# Patient Record
Sex: Female | Born: 1985 | Race: Black or African American | Hispanic: No | Marital: Married | Smoking: Never smoker
Health system: Southern US, Community
[De-identification: ages and names within clinical notes are randomized; demographics above are authoritative.]

## PROBLEM LIST (undated history)

## (undated) DIAGNOSIS — M5430 Sciatica, unspecified side: Secondary | ICD-10-CM

## (undated) DIAGNOSIS — Z800 Family history of malignant neoplasm of digestive organs: Secondary | ICD-10-CM

## (undated) DIAGNOSIS — Z789 Other specified health status: Secondary | ICD-10-CM

## (undated) HISTORY — PX: NO PAST SURGERIES: SHX2092

## (undated) HISTORY — DX: Other specified health status: Z78.9

## (undated) HISTORY — DX: Family history of malignant neoplasm of digestive organs: Z80.0

---

## 2016-03-17 ENCOUNTER — Encounter: Payer: Self-pay | Admitting: Internal Medicine

## 2016-03-17 ENCOUNTER — Ambulatory Visit (INDEPENDENT_AMBULATORY_CARE_PROVIDER_SITE_OTHER): Payer: BLUE CROSS/BLUE SHIELD | Admitting: Internal Medicine

## 2016-03-17 VITALS — BP 122/78 | HR 84 | Ht 69.0 in | Wt 339.0 lb

## 2016-03-17 DIAGNOSIS — J3089 Other allergic rhinitis: Secondary | ICD-10-CM | POA: Diagnosis not present

## 2016-03-17 DIAGNOSIS — N926 Irregular menstruation, unspecified: Secondary | ICD-10-CM | POA: Diagnosis not present

## 2016-03-17 DIAGNOSIS — Z6841 Body Mass Index (BMI) 40.0 and over, adult: Secondary | ICD-10-CM | POA: Diagnosis not present

## 2016-03-17 NOTE — Patient Instructions (Signed)
Breast Self-Awareness Practicing breast self-awareness may pick up problems early, prevent significant medical complications, and possibly save your life. By practicing breast self-awareness, you can become familiar with how your breasts look and feel and if your breasts are changing. This allows you to notice changes early. It can also offer you some reassurance that your breast health is good. One way to learn what is normal for your breasts and whether your breasts are changing is to do a breast self-exam. If you find a lump or something that was not present in the past, it is best to contact your caregiver right away. Other findings that should be evaluated by your caregiver include nipple discharge, especially if it is bloody; skin changes or reddening; areas where the skin seems to be pulled in (retracted); or new lumps and bumps. Breast pain is seldom associated with cancer (malignancy), but should also be evaluated by a caregiver. HOW TO PERFORM A BREAST SELF-EXAM The best time to examine your breasts is 5-7 days after your menstrual period is over. During menstruation, the breasts are lumpier, and it may be more difficult to pick up changes. If you do not menstruate, have reached menopause, or had your uterus removed (hysterectomy), you should examine your breasts at regular intervals, such as monthly. If you are breastfeeding, examine your breasts after a feeding or after using a breast pump. Breast implants do not decrease the risk for lumps or tumors, so continue to perform breast self-exams as recommended. Talk to your caregiver about how to determine the difference between the implant and breast tissue. Also, talk about the amount of pressure you should use during the exam. Over time, you will become more familiar with the variations of your breasts and more comfortable with the exam. A breast self-exam requires you to remove all your clothes above the waist. 1. Look at your breasts and nipples.  Stand in front of a mirror in a room with good lighting. With your hands on your hips, push your hands firmly downward. Look for a difference in shape, contour, and size from one breast to the other (asymmetry). Asymmetry includes puckers, dips, or bumps. Also, look for skin changes, such as reddened or scaly areas on the breasts. Look for nipple changes, such as discharge, dimpling, repositioning, or redness. 2. Carefully feel your breasts. This is best done either in the shower or tub while using soapy water or when flat on your back. Place the arm (on the side of the breast you are examining) above your head. Use the pads (not the fingertips) of your three middle fingers on your opposite hand to feel your breasts. Start in the underarm area and use  inch (2 cm) overlapping circles to feel your breast. Use 3 different levels of pressure (light, medium, and firm pressure) at each circle before moving to the next circle. The light pressure is needed to feel the tissue closest to the skin. The medium pressure will help to feel breast tissue a little deeper, while the firm pressure is needed to feel the tissue close to the ribs. Continue the overlapping circles, moving downward over the breast until you feel your ribs below your breast. Then, move one finger-width towards the center of the body. Continue to use the  inch (2 cm) overlapping circles to feel your breast as you move slowly up toward the collar bone (clavicle) near the base of the neck. Continue the up and down exam using all 3 pressures until you reach the   middle of the chest. Do this with each breast, carefully feeling for lumps or changes. 3.  Keep a written record with breast changes or normal findings for each breast. By writing this information down, you do not need to depend only on memory for size, tenderness, or location. Write down where you are in your menstrual cycle, if you are still menstruating. Breast tissue can have some lumps or  thick tissue. However, see your caregiver if you find anything that concerns you.  SEEK MEDICAL CARE IF:  You see a change in shape, contour, or size of your breasts or nipples.   You see skin changes, such as reddened or scaly areas on the breasts or nipples.   You have an unusual discharge from your nipples.   You feel a new lump or unusually thick areas.    This information is not intended to replace advice given to you by your health care provider. Make sure you discuss any questions you have with your health care provider.   Document Released: 12/01/2005 Document Revised: 11/17/2012 Document Reviewed: 03/17/2012 Elsevier Interactive Patient Education 2016 Elsevier Inc.  

## 2016-03-17 NOTE — Progress Notes (Signed)
Date:  03/17/2016   Name:  Morgan Grant   DOB:  12-12-1986   MRN:  [id number]   Chief Complaint: New Evaluation Needs to establish with PCP.  [place] in [place].  She thinks she had the flu last week but now feels pretty well. Previously in school at [employer] and was using the infirmary.  She has no concerns today.   Irregular menses - this has been a problem since puberty.  She has a menstrual cycle about every 8 weeks, lasting 4-5 days.  She has been evaluated by GYN - she does not have PCOS.  She tried a low dose OCP last year for 5 months.  She had good regulation of her cycles but did not like some of the vague side effects.  Since she was no longer requiring contraception, she stopped them.  She is currently not active sexually but may desire to resume OCPs in the future.  Seasonal Allergies - mild symptoms controlled with claritin or allegra.  No problems with shortness of breath or wheezing.  No hives or rash.  Review of Systems  Constitutional: Negative for fever, chills, fatigue and unexpected weight change.  HENT: Positive for postnasal drip and sneezing. Negative for hearing loss.   Eyes: Negative for visual disturbance.  Respiratory: Negative for choking, chest tightness, shortness of breath and wheezing.   Cardiovascular: Negative for chest pain, palpitations and leg swelling.  Gastrointestinal: Negative for abdominal pain.  Genitourinary: Positive for menstrual problem. Negative for dysuria and pelvic pain.  Musculoskeletal: Negative for joint swelling.  Neurological: Negative for dizziness and headaches.  Psychiatric/Behavioral: Negative for sleep disturbance. The patient is not nervous/anxious.     There are no active problems to display for this patient.   Prior to Admission medications   Not on File    No Known Allergies  Past Surgical History  Procedure Laterality Date  . No past surgeries      Social History  Substance Use Topics  .  Smoking status: Never Smoker   . Smokeless tobacco: None  . Alcohol Use: 0.0 oz/week    0 Standard drinks or equivalent per week     Comment: occasionally    Medication list has been reviewed and updated.   Physical Exam  Constitutional: She is oriented to person, place, and time. She appears well-developed. No distress.  HENT:  Head: Normocephalic and atraumatic.  Neck: Normal range of motion. Neck supple. No thyromegaly present.  Cardiovascular: Normal rate, regular rhythm and normal heart sounds.   Pulmonary/Chest: Effort normal and breath sounds normal. No respiratory distress.  Musculoskeletal: She exhibits no tenderness.  Neurological: She is alert and oriented to person, place, and time. She has normal reflexes.  Skin: Skin is warm and dry. No rash noted.  Psychiatric: She has a normal mood and affect. Her behavior is normal. Thought content normal.  Nursing note and vitals reviewed.   BP 122/78 mmHg  Pulse 84  Ht 5\' 9"  (1.753 m)  Wt 339 lb (153.769 kg)  BMI 50.04 kg/m2  LMP 02/25/2016  Assessment and Plan: 1. Irregular menses Long-standing with previously normal exams Can decrease Pap smears to every 3 yrs Consider resuming OCPs if needed  2. Environmental and seasonal allergies Continue over the counter claritin or allegra  3. BMI 50.0-59.9, adult [facility](HCC) Patient has had stable weight for many years Discussed regular exercise    Bari EdwardLaura Berglund, MD [facility]  03/17/2016  

## 2016-04-07 ENCOUNTER — Telehealth: Payer: Self-pay

## 2016-04-07 ENCOUNTER — Other Ambulatory Visit: Payer: Self-pay

## 2016-04-07 ENCOUNTER — Other Ambulatory Visit: Payer: Self-pay | Admitting: Internal Medicine

## 2016-04-07 MED ORDER — NORGESTIM-ETH ESTRAD TRIPHASIC 0.18/0.215/0.25 MG-35 MCG PO TABS: 1.0000 | ORAL_TABLET | Freq: Every day | ORAL | Status: DC

## 2016-04-07 NOTE — Telephone Encounter (Signed)
Patient called in today and states that she was told to call you and let you know which medication (Birth Control) she was on. She asked if you could send in Ortho Tricyclen generic.

## 2016-09-17 ENCOUNTER — Ambulatory Visit (INDEPENDENT_AMBULATORY_CARE_PROVIDER_SITE_OTHER): Payer: BLUE CROSS/BLUE SHIELD | Admitting: Internal Medicine

## 2016-09-17 ENCOUNTER — Encounter: Payer: Self-pay | Admitting: Internal Medicine

## 2016-09-17 VITALS — BP 121/84 | HR 60 | Resp 16 | Ht 69.0 in | Wt 335.8 lb

## 2016-09-17 DIAGNOSIS — Z111 Encounter for screening for respiratory tuberculosis: Secondary | ICD-10-CM

## 2016-09-17 DIAGNOSIS — Z23 Encounter for immunization: Secondary | ICD-10-CM | POA: Diagnosis not present

## 2016-09-17 NOTE — Progress Notes (Signed)
    Date:  09/17/2016   Name:  Morgan Grant   DOB:  10/21/1986   MRN:  [id number]   Chief Complaint: PPD Placement and Employment Physical (work clearance ) Patient presents for pre-employment exam. She also needs PPD. 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  Review of Systems  Constitutional: Negative for fatigue, fever and unexpected weight change.  HENT: Negative for hearing loss.   Eyes: Negative for visual disturbance.  Respiratory: Negative for cough, chest tightness and shortness of breath.   Cardiovascular: Negative for chest pain, palpitations and leg swelling.  Gastrointestinal: Negative for abdominal pain.  Endocrine: Negative for polydipsia and polyuria.  Genitourinary: Negative for dysuria and hematuria.  Musculoskeletal: Negative for arthralgias.  Neurological: Negative for tremors, numbness and headaches.  Psychiatric/Behavioral: Negative for dysphoric mood.    Patient Active Problem List   Diagnosis Date Noted  . Irregular menses 03/17/2016  . Environmental and seasonal allergies 03/17/2016  . BMI 50.0-59.9, adult (HCC) 03/17/2016    Prior to Admission medications   Not on File    No Known Allergies  Past Surgical History:  Procedure Laterality Date  . NO PAST SURGERIES      Social History  Substance Use Topics  . Smoking status: Never Smoker  . Smokeless tobacco: Never Used  . Alcohol use 0.0 oz/week     Comment: occasionally     Medication list has been reviewed and updated.   Physical Exam  Constitutional: She is oriented to person, place, and time. She appears well-developed. No distress.  HENT:  Head: Normocephalic and atraumatic.  Neck: Normal range of motion. Neck supple. No thyromegaly present.  Cardiovascular: Normal rate, regular rhythm and normal heart sounds.   Pulmonary/Chest: Effort normal and breath sounds normal. No respiratory distress.  Musculoskeletal: She exhibits no edema or tenderness.  Neurological: She is  alert and oriented to person, place, and time.  Skin: Skin is warm and dry. No rash noted.  Psychiatric: She has a normal mood and affect. Her speech is normal and behavior is normal. Thought content normal.  Nursing note and vitals reviewed.   BP 121/84   Pulse 60   Resp 16   Ht 5\' 9"  (1.753 m)   Wt (!) 335 lb 12.8 oz (152.3 kg)   LMP 08/06/2016 (Approximate)   SpO2 98%   BMI 49.59 kg/m   Assessment and Plan: 1. Need for influenza vaccination - Flu Vaccine QUAD 36+ mos PF IM (Fluarix & Fluzone Quad PF)  2. Screening for tuberculosis Work employment form completed Return in 2 days for PPD reading - PPD   Bari EdwardLaura Aarian Griffie, MD [facility]  09/17/2016

## 2016-12-16 ENCOUNTER — Other Ambulatory Visit: Payer: Self-pay | Admitting: Internal Medicine

## 2016-12-16 ENCOUNTER — Ambulatory Visit: Payer: BLUE CROSS/BLUE SHIELD | Admitting: Internal Medicine

## 2017-11-18 ENCOUNTER — Ambulatory Visit: Payer: BLUE CROSS/BLUE SHIELD | Admitting: Internal Medicine

## 2018-01-24 ENCOUNTER — Ambulatory Visit
Admission: EM | Admit: 2018-01-24 | Discharge: 2018-01-24 | Disposition: A | Discharge status: Home / Self Care | Payer: BLUE CROSS/BLUE SHIELD | Attending: Family Medicine | Admitting: Family Medicine

## 2018-01-24 ENCOUNTER — Encounter: Payer: Self-pay | Admitting: Gynecology

## 2018-01-24 ENCOUNTER — Other Ambulatory Visit: Payer: Self-pay

## 2018-01-24 DIAGNOSIS — M5432 Sciatica, left side: Secondary | ICD-10-CM

## 2018-01-24 HISTORY — DX: Sciatica, unspecified side: M54.30

## 2018-01-24 MED ORDER — CYCLOBENZAPRINE HCL 10 MG PO TABS: 10.0000 mg | ORAL_TABLET | Freq: Three times a day (TID) | ORAL | 0 refills | Status: DC | PRN

## 2018-01-24 MED ORDER — PREDNISONE 20 MG PO TABS: ORAL_TABLET | ORAL | 0 refills | Status: DC

## 2018-01-24 NOTE — ED Provider Notes (Signed)
[facility]    CSN: [id number] Arrival date & time: 01/24/18  9528     History   Chief Complaint Chief Complaint  Patient presents with  . Sciatica    HPI Morgan Grant is a 32 y.o. female.   The history is provided by the patient.  Back Pain  Location:  Lumbar spine Quality:  Aching Radiates to:  L posterior upper leg Pain severity:  Mild Pain is:  Same all the time Onset quality:  Sudden Duration:  3 months Timing:  Constant Progression:  Unchanged Chronicity:  New Context: lifting heavy objects   Relieved by:  Nothing Ineffective treatments: acupuncture, chiropractic. Associated symptoms: no abdominal pain, no abdominal swelling, no bladder incontinence, no bowel incontinence, no chest pain, no dysuria, no fever, no headaches, no leg pain, no numbness, no paresthesias, no pelvic pain, no perianal numbness, no tingling, no weakness and no weight loss   Risk factors: no hx of cancer, no hx of osteoporosis, no lack of exercise, no menopause, not obese, not pregnant, no recent surgery, no steroid use and no vascular disease     Past Medical History:  Diagnosis Date  . Sciatic nerve pain     Patient Active Problem List   Diagnosis Date Noted  . Irregular menses 03/17/2016  . Environmental and seasonal allergies 03/17/2016  . BMI 50.0-59.9, adult (HCC) 03/17/2016    Past Surgical History:  Procedure Laterality Date  . NO PAST SURGERIES      OB History    No data available       Home Medications    Prior to Admission medications   Medication Sig Start Date End Date Taking? Authorizing Provider  cyclobenzaprine (FLEXERIL) 10 MG tablet Take 1 tablet (10 mg total) by mouth 3 (three) times daily as needed for muscle spasms. 01/24/18   Payton Mccallum, MD  predniSONE (DELTASONE) 20 MG tablet 3 tabs po once day 1, then 2 tab po qd x 3 days, then 1 tab po qd x 3 days 01/24/18   Payton Mccallum, MD  TRI-PREVIFEM 0.18/0.215/0.25 MG-35 MCG tablet Take 1  tablet by mouth daily. 11/14/16   [provider]    Family History Family History  Problem Relation Age of Onset  . Hypertension Mother   . Diabetes Maternal Grandmother   . Pancreatic cancer Maternal Grandmother     Social History Social History   Tobacco Use  . Smoking status: Never Smoker  . Smokeless tobacco: Never Used  Substance Use Topics  . Alcohol use: Yes    Alcohol/week: 0.0 oz    Comment: occasionally  . Drug use: No     Allergies   Patient has no known allergies.   Review of Systems Review of Systems  Constitutional: Negative for fever and weight loss.  Cardiovascular: Negative for chest pain.  Gastrointestinal: Negative for abdominal pain and bowel incontinence.  Genitourinary: Negative for bladder incontinence, dysuria and pelvic pain.  Musculoskeletal: Positive for back pain.  Neurological: Negative for tingling, weakness, numbness, headaches and paresthesias.     Physical Exam Triage Vital Signs ED Triage Vitals  Enc Vitals Group     BP 01/24/18 0828 126/74     Pulse Rate 01/24/18 0828 60     Resp --      Temp 01/24/18 0828 98.2 F (36.8 C)     Temp Source 01/24/18 0828 Oral     SpO2 01/24/18 0828 100 %     Weight 01/24/18 0830 (!) 320 lb (145.2  kg)     Height 01/24/18 0830 5\' 8"  (1.727 m)     Head Circumference --      Peak Flow --      Pain Score 01/24/18 0830 8     Pain Loc --      Pain Edu? --      Excl. in GC? --    No data found.  Updated Vital Signs BP 126/74 (BP Location: Left Arm)   Pulse 60   Temp 98.2 F (36.8 C) (Oral)   Ht 5\' 8"  (1.727 m)   Wt (!) 320 lb (145.2 kg)   LMP 12/29/2017   SpO2 100%   BMI 48.66 kg/m   Visual Acuity Right Eye Distance:   Left Eye Distance:   Bilateral Distance:    Right Eye Near:   Left Eye Near:    Bilateral Near:     Physical Exam  Constitutional: She appears well-developed and well-nourished. No distress.  Musculoskeletal: She exhibits tenderness. She exhibits no  edema.       Lumbar back: She exhibits tenderness (over the lumbar paraspinous muscles and left buttock) and spasm. She exhibits normal range of motion, no bony tenderness, no swelling, no edema, no deformity, no laceration, no pain and normal pulse.  Neurological: She is alert. She has normal reflexes. She displays normal reflexes. She exhibits normal muscle tone.  Skin: Skin is warm and dry. No rash noted. She is not diaphoretic. No erythema.  Nursing note and vitals reviewed.    UC Treatments / Results  Labs (all labs ordered are listed, but only abnormal results are displayed) Labs Reviewed - No data to display  EKG  EKG Interpretation None       Radiology No results found.  Procedures Procedures (including critical care time)  Medications Ordered in UC Medications - No data to display   Initial Impression / Assessment and Plan / UC Course  I have reviewed the triage vital signs and the nursing notes.  Pertinent labs & imaging results that were available during my care of the patient were reviewed by me and considered in my medical decision making (see chart for details).       Final Clinical Impressions(s) / UC Diagnoses   Final diagnoses:  Sciatica of left side    ED Discharge Orders        Ordered    predniSONE (DELTASONE) 20 MG tablet     01/24/18 0917    cyclobenzaprine (FLEXERIL) 10 MG tablet  3 times daily PRN     01/24/18 0917     1. diagnosis reviewed with patient 2. rx as per orders above; reviewed possible side effects, interactions, risks and benefits  3. Follow-up prn if symptoms worsen or don't improve Controlled Substance Prescriptions [place] Controlled Substance Registry consulted? Not Applicable   Payton Mccallumonty, Minetta Krisher, MD 01/24/18 [id number]

## 2018-01-24 NOTE — ED Triage Notes (Signed)
Patient c/o sciatic nerve pain diagnose x 4 yrs ago. Per patient x 3 months pain is worse.

## 2018-09-21 LAB — CBC AND DIFFERENTIAL
HCT: 40 (ref 36–46)
Hemoglobin: 13.6 (ref 12.0–16.0)
Platelets: 302 (ref 150–399)
WBC: 7.1

## 2018-09-21 LAB — BASIC METABOLIC PANEL
BUN: 6 (ref 4–21)
Creatinine: 0.8 (ref 0.5–1.1)
Glucose: 80
Potassium: 3.9 (ref 3.4–5.3)
Sodium: 140 (ref 137–147)

## 2018-09-21 LAB — LIPID PANEL
HDL: 47 (ref 35–70)
LDL Cholesterol: 110
Triglycerides: 70 (ref 40–160)

## 2018-09-21 LAB — HIV ANTIBODY (ROUTINE TESTING W REFLEX): HIV 1&2 Ab, 4th Generation: NONREACTIVE

## 2018-09-21 LAB — HEMOGLOBIN A1C: Hemoglobin A1C: 5.7

## 2018-09-21 LAB — HM PAP SMEAR: HM Pap smear: NEGATIVE

## 2019-09-09 ENCOUNTER — Ambulatory Visit: Payer: Self-pay | Admitting: Internal Medicine

## 2019-09-30 ENCOUNTER — Encounter: Payer: Self-pay | Admitting: Internal Medicine

## 2019-09-30 ENCOUNTER — Other Ambulatory Visit: Payer: Self-pay

## 2019-09-30 ENCOUNTER — Ambulatory Visit: Payer: BC Managed Care – PPO | Admitting: Internal Medicine

## 2019-09-30 VITALS — BP 118/82 | HR 69 | Ht 68.0 in | Wt 333.0 lb

## 2019-09-30 DIAGNOSIS — Z23 Encounter for immunization: Secondary | ICD-10-CM

## 2019-09-30 DIAGNOSIS — Z6841 Body Mass Index (BMI) 40.0 and over, adult: Secondary | ICD-10-CM | POA: Diagnosis not present

## 2019-09-30 DIAGNOSIS — F39 Unspecified mood [affective] disorder: Secondary | ICD-10-CM | POA: Diagnosis not present

## 2019-09-30 DIAGNOSIS — N926 Irregular menstruation, unspecified: Secondary | ICD-10-CM | POA: Diagnosis not present

## 2019-09-30 NOTE — Progress Notes (Signed)
Date:  09/30/2019   Name:  Morgan Grant   DOB:  11-Oct-1986   MRN:  [id number]   Chief Complaint: Establish Care and Depression (12-PHQ9. GAD7-16. Would like to be referred to Dr. Mady Haagensen in [facility]. Address: [address], [place], [place] [address])  Depression        This is a new problem.The problem is unchanged.  Associated symptoms include insomnia, restlessness and decreased interest.  Associated symptoms include no fatigue, no headaches and no suicidal ideas.     The symptoms are aggravated by social issues (mostly by Covid restrictions).  Past treatments include psychotherapy (therapy in the past but did not stick with it).  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  she is in a new relationship that is going very well.     Review of Systems  Constitutional: Negative for chills, fatigue and fever.  Respiratory: Negative for cough, chest tightness and shortness of breath.   Cardiovascular: Negative for chest pain, palpitations and leg swelling.  Gastrointestinal: Negative for constipation, diarrhea and nausea.  Musculoskeletal: Negative for arthralgias and joint swelling.  Neurological: Negative for dizziness, weakness and headaches.  Psychiatric/Behavioral: Positive for depression, dysphoric mood and sleep disturbance. Negative for suicidal ideas. The patient is nervous/anxious and has insomnia.     Patient Active Problem List   Diagnosis Date Noted  . Irregular menses 03/17/2016  . Environmental and seasonal allergies 03/17/2016  . BMI 50.0-59.9, adult ([place]) 03/17/2016    No Known Allergies  Past Surgical History:  Procedure Laterality Date  . NO PAST SURGERIES      Social History   Tobacco Use  . Smoking status: Never Smoker  . Smokeless tobacco: Never Used  Substance Use Topics  . Alcohol use: Yes    Alcohol/week: 0.0 standard drinks    Comment: occasionally  . Drug use: No     Medication list has been  reviewed and updated.  Current Meds  Medication Sig  . JUNEL 1/20 1-20 MG-MCG tablet Take 1 tablet by mouth daily.    PHQ 2/9 Scores 09/30/2019  PHQ - 2 Score 3  PHQ- 9 Score 12   GAD 7 : Generalized Anxiety Score 09/30/2019  Nervous, Anxious, on Edge 3  Control/stop worrying 3  Worry too much - different things 3  Trouble relaxing 3  Restless 1  Easily annoyed or irritable 1  Afraid - awful might happen 2  Total GAD 7 Score 16  Anxiety Difficulty Somewhat difficult     BP Readings from Last 3 Encounters:  09/30/19 118/82  01/24/18 126/74  09/17/16 121/84    Physical Exam Vitals signs and nursing note reviewed.  Constitutional:      General: She is not in acute distress.    Appearance: Normal appearance. She is well-developed.  HENT:     Head: Normocephalic and atraumatic.  Neck:     Musculoskeletal: Normal range of motion.  Cardiovascular:     Rate and Rhythm: Normal rate and regular rhythm.     Pulses: Normal pulses.     Heart sounds: No murmur.  Pulmonary:     Effort: Pulmonary effort is normal. No respiratory distress.     Breath sounds: No wheezing or rhonchi.  Musculoskeletal:     Right lower leg: No edema.     Left lower leg: No edema.  Lymphadenopathy:     Cervical: No cervical adenopathy.  Skin:    General: Skin is  warm and dry.     Capillary Refill: Capillary refill takes less than 2 seconds.     Findings: No rash.  Neurological:     General: No focal deficit present.     Mental Status: She is alert and oriented to person, place, and time.  Psychiatric:        Behavior: Behavior normal.        Thought Content: Thought content normal.     Wt Readings from Last 3 Encounters:  09/30/19 (!) 333 lb (151 kg)  01/24/18 (!) 320 lb (145.2 kg)  09/17/16 (!) 335 lb 12.8 oz (152.3 kg)    BP 118/82   Pulse 69   Ht 5\' 8"  (1.727 m)   Wt (!) 333 lb (151 kg)   SpO2 98%   BMI 50.63 kg/m   Assessment and Plan: 1. Mood disorder (HCC) Recommend  that pt see a counselor - she can self refer to the practice of her choice and if a referral is needed, she will call. She is clinically stable without SI/HI; she has excellent social support   2. BMI 50.0-59.9, adult (HCC) Continue efforts at exercise and weight loss  3. Irregular menses On OCPs; pap normal last year Will get prior records   Partially dictated using 04-12-1995. Any errors are unintentional.  Animal nutritionist, MD [facility]  09/30/2019

## 2020-02-15 LAB — FETAL NONSTRESS TEST

## 2020-03-29 ENCOUNTER — Encounter: Payer: BC Managed Care – PPO | Admitting: Internal Medicine

## 2020-03-29 NOTE — Progress Notes (Deleted)
    Date:  03/29/2020   Name:  Morgan Grant   DOB:  1986-06-25   MRN:  [id number]   Chief Complaint: No chief complaint on file. Morgan Grant is a 34 y.o. female who presents today for her Complete Annual Exam. She feels {DESC; WELL/FAIRLY WELL/POORLY:18703}. She reports exercising ***. She reports she is sleeping {DESC; WELL/FAIRLY WELL/POORLY:18703}.   Pap  09/2018 Immunization History  Administered Date(s) Administered  . Influenza,inj,Quad PF,6+ Mos 09/17/2016, 09/30/2019  . Influenza-Unspecified 09/19/2018  . PPD Test 09/17/2016  . Tdap 09/19/2018    HPI  Lab Results  Component Value Date   CREATININE 0.8 09/21/2018   BUN 6 09/21/2018   NA 140 09/21/2018   K 3.9 09/21/2018   Lab Results  Component Value Date   HDL 47 09/21/2018   LDLCALC 110 09/21/2018   TRIG 70 09/21/2018   No results found for: TSH Lab Results  Component Value Date   HGBA1C 5.7 09/21/2018   Lab Results  Component Value Date   WBC 7.1 09/21/2018   HGB 13.6 09/21/2018   HCT 40 09/21/2018   PLT 302 09/21/2018   No results found for: ALT, AST, GGT, ALKPHOS, BILITOT   Review of Systems  Patient Active Problem List   Diagnosis Date Noted  . Irregular menses 03/17/2016  . Environmental and seasonal allergies 03/17/2016  . BMI 50.0-59.9, adult (HCC) 03/17/2016    No Known Allergies  Past Surgical History:  Procedure Laterality Date  . NO PAST SURGERIES      Social History   Tobacco Use  . Smoking status: Never Smoker  . Smokeless tobacco: Never Used  Substance Use Topics  . Alcohol use: Yes    Alcohol/week: 0.0 standard drinks    Comment: occasionally  . Drug use: No     Medication list has been reviewed and updated.  No outpatient medications have been marked as taking for the 03/29/20 encounter (Appointment) with Reubin Milan, MD.    [facility] 2/9 Scores 09/30/2019  PHQ - 2 Score 3  PHQ- 9 Score 12    BP Readings from Last 3 Encounters:  09/30/19 118/82  01/24/18  126/74  09/17/16 121/84    Physical Exam  Wt Readings from Last 3 Encounters:  09/30/19 (!) 333 lb (151 kg)  01/24/18 (!) 320 lb (145.2 kg)  09/17/16 (!) 335 lb 12.8 oz (152.3 kg)    There were no vitals taken for this visit.  Assessment and Plan:

## 2020-03-30 ENCOUNTER — Encounter: Payer: BC Managed Care – PPO | Admitting: Internal Medicine

## 2020-04-19 ENCOUNTER — Other Ambulatory Visit: Payer: Self-pay

## 2020-04-19 ENCOUNTER — Ambulatory Visit: Payer: BC Managed Care – PPO | Admitting: Obstetrics and Gynecology

## 2020-04-19 ENCOUNTER — Encounter: Payer: Self-pay | Admitting: Obstetrics and Gynecology

## 2020-04-19 VITALS — BP 137/83 | HR 53 | Ht 69.49 in | Wt 314.8 lb

## 2020-04-19 DIAGNOSIS — T192XXS Foreign body in vulva and vagina, sequela: Secondary | ICD-10-CM

## 2020-04-19 NOTE — Progress Notes (Signed)
HPI:      Morgan Grant is a 34 y.o. No obstetric history on file. who LMP was Patient's last menstrual period was 04/17/2020 (exact date).  Subjective:   She presents today because she began her menses and realized that she had a retained tampon from her menses 1 month ago. She noticed it at the introitus. She removed it herself. She called her family physician and was placed on Flagyl she is partway through the course of Flagyl. She has no specific complaints has noticed no fevers or other systemic effects. She is in the middle of a normal menses at this time and is using pads for menstrual bleeding. Patient takes OCPs and has normal regular cycles on OCPs.    Hx: The following portions of the patient's history were reviewed and updated as appropriate:             She  has no past medical history on file. She does not have any pertinent problems on file. She  has a past surgical history that includes No past surgeries. Her family history includes Cancer in her father; Diabetes in her maternal grandmother; Hypertension in her mother; Pancreatic cancer in her maternal grandmother. She  reports that she has never smoked. She has never used smokeless tobacco. She reports current alcohol use. She reports that she does not use drugs. She has a current medication list which includes the following prescription(s): junel 1/20 and metronidazole. She has No Known Allergies.       Review of Systems:  Review of Systems  Constitutional: Denied constitutional symptoms, night sweats, recent illness, fatigue, fever, insomnia and weight loss.  Eyes: Denied eye symptoms, eye pain, photophobia, vision change and visual disturbance.  Ears/Nose/Throat/Neck: Denied ear, nose, throat or neck symptoms, hearing loss, nasal discharge, sinus congestion and sore throat.  Cardiovascular: Denied cardiovascular symptoms, arrhythmia, chest pain/pressure, edema, exercise intolerance, orthopnea and palpitations.   Respiratory: Denied pulmonary symptoms, asthma, pleuritic pain, productive sputum, cough, dyspnea and wheezing.  Gastrointestinal: Denied, gastro-esophageal reflux, melena, nausea and vomiting.  Genitourinary: Denied genitourinary symptoms including symptomatic vaginal discharge, pelvic relaxation issues, and urinary complaints.  Musculoskeletal: Denied musculoskeletal symptoms, stiffness, swelling, muscle weakness and myalgia.  Dermatologic: Denied dermatology symptoms, rash and scar.  Neurologic: Denied neurology symptoms, dizziness, headache, neck pain and syncope.  Psychiatric: Denied psychiatric symptoms, anxiety and depression.  Endocrine: Denied endocrine symptoms including hot flashes and night sweats.   Meds:   Current Outpatient Medications on File Prior to Visit  Medication Sig Dispense Refill  . JUNEL 1/20 1-20 MG-MCG tablet Take 1 tablet by mouth daily.    . metroNIDAZOLE (FLAGYL) 500 MG tablet Take 500 mg by mouth 2 (two) times daily.     No current facility-administered medications on file prior to visit.    Objective:     Vitals:   04/19/20 0854  BP: 137/83  Pulse: (!) 53    Patient not having any symptoms, is currently bleeding with her menses and declined examination today.            Assessment:    No obstetric history on file. Patient Active Problem List   Diagnosis Date Noted  . Irregular menses 03/17/2016  . Environmental and seasonal allergies 03/17/2016  . BMI 50.0-59.9, adult ([place]) 03/17/2016     1. Retained tampon, sequela     Patient was able to remove tampon by herself and is partly through a course of Flagyl prescribed by her primary care physician.   Plan:  1. Expect no further issues from retained tampon.  2. Patient to return for annual examination and Pap smear. Orders No orders of the defined types were placed in this encounter.   No orders of the defined types were placed in this encounter.     F/U  Return in about  1 month (around 05/20/2020) for Annual Physical. I spent 18 minutes involved in the care of this patient preparing to see the patient by obtaining and reviewing her medical history (including labs, imaging tests and prior procedures), documenting clinical information in the electronic health record (EHR), counseling and coordinating care plans, writing and sending prescriptions, ordering tests or procedures and directly communicating with the patient by discussing pertinent items from her history and physical exam as well as detailing my assessment and plan as noted above so that she has an informed understanding.  All of her questions were answered.  Elonda Husky, M.D. 04/19/2020 9:17 AM

## 2020-05-28 ENCOUNTER — Other Ambulatory Visit: Payer: Self-pay

## 2020-05-28 ENCOUNTER — Ambulatory Visit
Admission: EM | Admit: 2020-05-28 | Discharge: 2020-05-28 | Disposition: A | Discharge status: Home / Self Care | Payer: BC Managed Care – PPO | Attending: Family Medicine | Admitting: Family Medicine

## 2020-05-28 ENCOUNTER — Encounter: Payer: Self-pay | Admitting: Emergency Medicine

## 2020-05-28 DIAGNOSIS — M62838 Other muscle spasm: Secondary | ICD-10-CM | POA: Diagnosis not present

## 2020-05-28 MED ORDER — KETOROLAC TROMETHAMINE 60 MG/2ML IM SOLN
60.0000 mg | Freq: Once | INTRAMUSCULAR | Status: AC
  Administered 2020-05-28: 60 mg via INTRAMUSCULAR

## 2020-05-28 MED ORDER — CYCLOBENZAPRINE HCL 10 MG PO TABS: 10.0000 mg | ORAL_TABLET | Freq: Two times a day (BID) | ORAL | 0 refills | Status: DC | PRN

## 2020-05-28 NOTE — ED Provider Notes (Signed)
[facility] ____________________________________________  Time seen: Approximately 12:00 PM  I have reviewed the triage vital signs and the nursing notes.   HISTORY  Chief Complaint Back Pain   HPI Morgan Grant is a 34 y.o. female present for evaluation of left lower back pain.  Patient reports she is recent been exercising more and has had some tightness in her low back on the left side.  States this morning she was leaning doing laundry and felt a spasming pain to left low back.  States pain is worse with movement, better with rest.  Describes pain as a catching spasming pain.  Denies pain radiation, paresthesias, fall, direct injury.  Denies chest pain or shortness of breath, abdominal pain, dysuria, bowel changes.  Has not take any over-the-counter medication for the same complaint.  Reports otherwise doing well.  Patient's last menstrual period was 05/07/2020.  Denies pregnancy. Morgan Hess, MD : PCP  History reviewed. No pertinent past medical history.  Patient Active Problem List   Diagnosis Date Noted  . Irregular menses 03/17/2016  . Environmental and seasonal allergies 03/17/2016  . BMI 50.0-59.9, adult ([place]) 03/17/2016    Past Surgical History:  Procedure Laterality Date  . NO PAST SURGERIES       No current facility-administered medications for this encounter.  Current Outpatient Medications:  .  JUNEL 1/20 1-20 MG-MCG tablet, Take 1 tablet by mouth daily., Disp: , Rfl:  .  cyclobenzaprine (FLEXERIL) 10 MG tablet, Take 1 tablet (10 mg total) by mouth 2 (two) times daily as needed for muscle spasms. Do not drive while taking as can cause drowsiness, Disp: 20 tablet, Rfl: 0  Allergies Patient has no known allergies.  Family History  Problem Relation Age of Onset  . Hypertension Mother   . Cancer Father   . Diabetes Maternal Grandmother   . Pancreatic cancer Maternal Grandmother     Social History Social History   Tobacco Use  .  Smoking status: Never Smoker  . Smokeless tobacco: Never Used  Vaping Use  . Vaping Use: Never used  Substance Use Topics  . Alcohol use: Yes    Alcohol/week: 0.0 standard drinks    Comment: occasionally  . Drug use: No    Review of Systems Constitutional: No fever Cardiovascular: Denies chest pain. Respiratory: Denies shortness of breath. Gastrointestinal: No abdominal pain.  No nausea, no vomiting.  No diarrhea.  No constipation. Genitourinary: Negative for dysuria. Musculoskeletal: Positive for back pain. Skin: Negative for rash.   ____________________________________________   PHYSICAL EXAM:  VITAL SIGNS: ED Triage Vitals  Enc Vitals Group     BP 05/28/20 1123 135/75     Pulse Rate 05/28/20 1123 63     Resp 05/28/20 1123 18     Temp 05/28/20 1123 98.1 F (36.7 C)     Temp Source 05/28/20 1123 Oral     SpO2 05/28/20 1123 100 %     Weight 05/28/20 1122 (!) 308 lb (139.7 kg)     Height 05/28/20 1122 5\' 8"  (1.727 m)     Head Circumference --      Peak Flow --      Pain Score 05/28/20 1122 5     Pain Loc --      Pain Edu? --      Excl. in [place]? --     Constitutional: Alert and oriented. Well appearing and in no acute distress. Eyes: Conjunctivae are normal.  ENT      Head: Normocephalic and  atraumatic. Cardiovascular: Normal rate, regular rhythm. Grossly normal heart sounds.  Good peripheral circulation. Respiratory: Normal respiratory effort without tachypnea nor retractions. Breath sounds are clear and equal bilaterally. No wheezes, rales, rhonchi. Gastrointestinal:  No CVA tenderness. Musculoskeletal: No midline cervical, thoracic or lumbar tenderness to palpation. Except: Left lower thoracic to lumbar back tenderness along latissimus dorsi with palpable muscle spasm with overhead reaching, mild pain with lumbar flexion and extension as well as rotation, no midline tenderness, no saddle anesthesia, steady gait, changes positions quickly. Neurologic:  Normal  speech and language.Speech is normal. No gait instability.  Skin:  Skin is warm, dry and intact. No rash noted. Psychiatric: Mood and affect are normal. Speech and behavior are normal. Patient exhibits appropriate insight and judgment   ___________________________________________   LABS (all labs ordered are listed, but only abnormal results are displayed)  Labs Reviewed - No data to display ____________________________________________    PROCEDURES Procedures   INITIAL IMPRESSION / ASSESSMENT AND PLAN / ED COURSE  Pertinent labs & imaging results that were available during my care of the patient were reviewed by me and considered in my medical decision making (see chart for details).  Well-appearing patient.  No acute distress.  Suspect back strain with muscle spasm.  60 mg IM Toradol given once in urgent care.  Will treat with.  Flexeril.  800 mg ibuprofen over-the-counter 3 times a day as needed.  Ice heat stretch.  Supportive care.Discussed indication, risks and benefits of medications with patient.   Discussed follow up with Primary care physician this week as needed. Discussed follow up and return parameters including no resolution or any worsening concerns. Patient verbalized understanding and agreed to plan.   ____________________________________________   FINAL CLINICAL IMPRESSION(S) / ED DIAGNOSES  Final diagnoses:  Muscle spasm     ED Discharge Orders         Ordered    cyclobenzaprine (FLEXERIL) 10 MG tablet  2 times daily PRN     Discontinue  Reprint     05/28/20 1149           Note: This dictation was prepared with Dragon dictation along with smaller phrase technology. Any transcriptional errors that result from this process are unintentional.          Renford Dills, NP 05/28/20 1324

## 2020-05-28 NOTE — ED Triage Notes (Signed)
Patient c/o back spasm that started this morning when she was putting laundry in the basket.

## 2020-05-28 NOTE — Discharge Instructions (Signed)
Take medication as prescribed. Rest. Drink plenty of fluids.  Over-the-counter ibuprofen.  Ice/heat  Follow up with your primary care physician this week as needed. Return to Urgent care for new or worsening concerns.

## 2020-06-02 ENCOUNTER — Other Ambulatory Visit: Payer: Self-pay

## 2020-06-02 DIAGNOSIS — T7840XA Allergy, unspecified, initial encounter: Secondary | ICD-10-CM | POA: Insufficient documentation

## 2020-06-02 DIAGNOSIS — Z793 Long term (current) use of hormonal contraceptives: Secondary | ICD-10-CM | POA: Diagnosis not present

## 2020-06-02 DIAGNOSIS — Z79899 Other long term (current) drug therapy: Secondary | ICD-10-CM | POA: Insufficient documentation

## 2020-06-02 NOTE — ED Triage Notes (Signed)
Patient reports got bitten by what she thinks was a horse fly around 8 pm on left forehead.  Then later noticed that eyes felt heavy and when she looked realized they were swollen.  States she took Zyrtec around 10:30.  Patient denies any other complaints - respiratory distress, or swelling of the mouth or tongue.  Patient is able to speak in complete sentences.

## 2020-06-03 ENCOUNTER — Emergency Department
Admission: EM | Admit: 2020-06-03 | Discharge: 2020-06-03 | Discharge status: Against Medical Advice | Payer: BC Managed Care – PPO | Attending: Emergency Medicine | Admitting: Emergency Medicine

## 2020-06-03 DIAGNOSIS — T7840XA Allergy, unspecified, initial encounter: Secondary | ICD-10-CM

## 2020-06-03 MED ORDER — PREDNISONE 20 MG PO TABS
60.0000 mg | ORAL_TABLET | Freq: Once | ORAL | Status: DC
  Filled 2020-06-03: qty 3

## 2020-06-03 MED ORDER — PREDNISONE 20 MG PO TABS: 60.0000 mg | ORAL_TABLET | Freq: Every day | ORAL | 0 refills | Status: AC

## 2020-06-03 MED ORDER — DIPHENHYDRAMINE HCL 25 MG PO CAPS
50.0000 mg | ORAL_CAPSULE | Freq: Once | ORAL | Status: DC
  Filled 2020-06-03: qty 2

## 2020-06-03 MED ORDER — FAMOTIDINE 20 MG PO TABS
20.0000 mg | ORAL_TABLET | Freq: Once | ORAL | Status: DC
  Filled 2020-06-03: qty 1

## 2020-06-03 MED ORDER — FAMOTIDINE 20 MG PO TABS: 20.0000 mg | ORAL_TABLET | Freq: Every day | ORAL | 1 refills | Status: DC

## 2020-06-03 MED ORDER — CETIRIZINE HCL 10 MG PO TABS
10.0000 mg | ORAL_TABLET | Freq: Every day | ORAL | 0 refills | Status: DC
Start: 2020-06-03 — End: 2020-08-17

## 2020-06-03 NOTE — ED Notes (Signed)
Care delayed due to emergent situation in another room. Entered room to find it empty. Pt did not return after 15 minute wait. Provider notified.

## 2020-06-03 NOTE — ED Provider Notes (Signed)
[facility] Emergency Department Provider Note  ____________________________________________  Time seen: Approximately 6:19 AM  I have reviewed the triage vital signs and the nursing notes.   HISTORY  Chief Complaint Allergic Reaction   HPI Morgan Grant is a 34 y.o. female with history of environmental and seasonal allergies who presents for evaluation of an allergic reaction.  Patient reports that she was at her wedding today.  The wedding was outdoors.  She felt like she was bitten in the forehead by horse flies.  Later in the evening she noticed swelling of bilateral upper eyelids.  No tongue swelling, no angioedema, no throat closing sensation, no shortness of breath, no vomiting or diarrhea.  She denies ever having an allergic reaction to an insect bite in the past.  She took some Zyrtec with no significant improvement.   No past medical history on file.  Patient Active Problem List   Diagnosis Date Noted  . Irregular menses 03/17/2016  . Environmental and seasonal allergies 03/17/2016  . BMI 50.0-59.9, adult ([place]) 03/17/2016    Past Surgical History:  Procedure Laterality Date  . NO PAST SURGERIES      Prior to Admission medications   Medication Sig Start Date End Date Taking? Authorizing Provider  cetirizine (ZYRTEC ALLERGY) 10 MG tablet Take 1 tablet (10 mg total) by mouth daily for 5 days. 06/03/20 06/08/20  Rudene Re, MD  cyclobenzaprine (FLEXERIL) 10 MG tablet Take 1 tablet (10 mg total) by mouth 2 (two) times daily as needed for muscle spasms. Do not drive while taking as can cause drowsiness 05/28/20   Marylene Land, NP  famotidine (PEPCID) 20 MG tablet Take 1 tablet (20 mg total) by mouth daily for 5 days. 06/03/20 06/08/20  Rudene Re, MD  JUNEL 1/20 1-20 MG-MCG tablet Take 1 tablet by mouth daily. 07/12/19   [provider]  predniSONE (DELTASONE) 20 MG tablet Take 3 tablets (60 mg total) by mouth daily for 4 days.  06/03/20 06/07/20  Rudene Re, MD    Allergies Patient has no known allergies.  Family History  Problem Relation Age of Onset  . Hypertension Mother   . Cancer Father   . Diabetes Maternal Grandmother   . Pancreatic cancer Maternal Grandmother     Social History Social History   Tobacco Use  . Smoking status: Never Smoker  . Smokeless tobacco: Never Used  Vaping Use  . Vaping Use: Never used  Substance Use Topics  . Alcohol use: Yes    Alcohol/week: 0.0 standard drinks    Comment: occasionally  . Drug use: No    Review of Systems  Constitutional: Negative for fever. Eyes: Negative for visual changes. + Bilateral eyelid swelling ENT: Negative for sore throat. Neck: No neck pain  Cardiovascular: Negative for chest pain. Respiratory: Negative for shortness of breath. Gastrointestinal: Negative for abdominal pain, vomiting or diarrhea. Genitourinary: Negative for dysuria. Musculoskeletal: Negative for back pain. Skin: Negative for rash. Neurological: Negative for headaches, weakness or numbness. Psych: No SI or HI  ____________________________________________   PHYSICAL EXAM:  VITAL SIGNS: ED Triage Vitals  Enc Vitals Group     BP 06/02/20 2331 (!) 134/94     Pulse Rate 06/02/20 2331 70     Resp 06/02/20 2331 18     Temp 06/02/20 2331 97.8 F (36.6 C)     Temp Source 06/02/20 2331 Oral     SpO2 06/02/20 2331 99 %     Weight 06/02/20 2329 (!) 308 lb (  139.7 kg)     Height 06/02/20 2329 5\' 8"  (1.727 m)     Head Circumference --      Peak Flow --      Pain Score 06/02/20 2329 0     Pain Loc --      Pain Edu? --      Excl. in [place]? --     Constitutional: Alert and oriented. Well appearing and in no apparent distress. HEENT:      Head: Normocephalic and atraumatic.         Eyes: Conjunctivae are normal. Sclera is non-icteric.  Swelling of bilateral eyelids with no erythema or warmth      Mouth/Throat: Mucous membranes are moist.  Uvula and tongue are  normal with no swelling.  No injury edema.      Neck: Supple with no signs of meningismus.  Airway is patent with no stridor Cardiovascular: Regular rate and rhythm.  Respiratory: Normal respiratory effort. Lungs are clear to auscultation bilaterally. No wheezes, crackles, or rhonchi.  Gastrointestinal: Soft, non tender. Musculoskeletal: Nontender with normal range of motion in all extremities. No edema, cyanosis, or erythema of extremities. Neurologic: Normal speech and language. Face is symmetric. Moving all extremities. No gross focal neurologic deficits are appreciated. Skin: Skin is warm, dry and intact. No rash noted. Psychiatric: Mood and affect are normal. Speech and behavior are normal.  ____________________________________________   LABS (all labs ordered are listed, but only abnormal results are displayed)  Labs Reviewed - No data to display ____________________________________________  EKG  none  ____________________________________________  RADIOLOGY  none  ____________________________________________   PROCEDURES  Procedure(s) performed: None Procedures Critical Care performed:  None ____________________________________________   INITIAL IMPRESSION / ASSESSMENT AND PLAN / ED COURSE  34 y.o. female with history of environmental and seasonal allergies who presents for evaluation of an allergic reaction.  Patient with bilateral eyelid swelling after being bitten by a fly.  No signs of anaphylaxis.  Patient took Zyrtec.  Symptoms have been ongoing for over 5 hours.  Prednisone, Benadryl, and Pepcid ordered.  Unfortunately, soon as I left the patient's room, a critical patient came in which kept me away for at least an hour.  Upon return to the room patient had not received any of the medications ordered and had eloped.  I did send prescriptions to the pharmacy and left her a message on her cell phone to pick up the prescriptions.  When I was in the room with her, I  had discussed follow-up and my standard return precautions.  Old medical records reviewed.     _____________________________________________ Please note:  Patient was evaluated in Emergency Department today for the symptoms described in the history of present illness. Patient was evaluated in the context of the global COVID-19 pandemic, which necessitated consideration that the patient might be at risk for infection with the SARS-CoV-2 virus that causes COVID-19. Institutional protocols and algorithms that pertain to the evaluation of patients at risk for COVID-19 are in a state of rapid change based on information released by regulatory bodies including the CDC and federal and state organizations. These policies and algorithms were followed during the patient's care in the ED.  Some ED evaluations and interventions may be delayed as a result of limited staffing during the pandemic.   [place] Controlled Substance Database was reviewed by me. ____________________________________________   FINAL CLINICAL IMPRESSION(S) / ED DIAGNOSES   Final diagnoses:  Allergic reaction, initial encounter      NEW MEDICATIONS STARTED  DURING THIS VISIT:  ED Discharge Orders         Ordered    predniSONE (DELTASONE) 20 MG tablet  Daily     Discontinue  Reprint     06/03/20 0420    famotidine (PEPCID) 20 MG tablet  Daily     Discontinue  Reprint     06/03/20 0420    cetirizine (ZYRTEC ALLERGY) 10 MG tablet  Daily     Discontinue  Reprint     06/03/20 0420           Note:  This document was prepared using Dragon voice recognition software and may include unintentional dictation errors.    Don Perking, [place], MD 06/03/20 [phone]

## 2020-06-03 NOTE — Discharge Instructions (Signed)
Take medications as prescribed for symptom relief.  If your symptoms improve in the next 24 hours it will have to take any of these medications.  Keep a close eye on the site of the bites to rule out

## 2020-08-15 DIAGNOSIS — Z1501 Genetic susceptibility to malignant neoplasm of breast: Secondary | ICD-10-CM

## 2020-08-15 DIAGNOSIS — Z1509 Genetic susceptibility to other malignant neoplasm: Secondary | ICD-10-CM

## 2020-08-15 HISTORY — DX: Genetic susceptibility to malignant neoplasm of breast: Z15.01

## 2020-08-15 HISTORY — DX: Genetic susceptibility to other malignant neoplasm: Z15.09

## 2020-08-17 ENCOUNTER — Ambulatory Visit (INDEPENDENT_AMBULATORY_CARE_PROVIDER_SITE_OTHER): Payer: BC Managed Care – PPO | Admitting: Obstetrics and Gynecology

## 2020-08-17 ENCOUNTER — Other Ambulatory Visit: Payer: Self-pay

## 2020-08-17 ENCOUNTER — Encounter: Payer: Self-pay | Admitting: Obstetrics and Gynecology

## 2020-08-17 VITALS — BP 122/74 | Wt 304.0 lb

## 2020-08-17 DIAGNOSIS — O99211 Obesity complicating pregnancy, first trimester: Secondary | ICD-10-CM

## 2020-08-17 DIAGNOSIS — O0991 Supervision of high risk pregnancy, unspecified, first trimester: Secondary | ICD-10-CM

## 2020-08-17 DIAGNOSIS — Z3A09 9 weeks gestation of pregnancy: Secondary | ICD-10-CM

## 2020-08-17 DIAGNOSIS — O26811 Pregnancy related exhaustion and fatigue, first trimester: Secondary | ICD-10-CM

## 2020-08-17 DIAGNOSIS — O0990 Supervision of high risk pregnancy, unspecified, unspecified trimester: Secondary | ICD-10-CM | POA: Insufficient documentation

## 2020-08-17 DIAGNOSIS — O99210 Obesity complicating pregnancy, unspecified trimester: Secondary | ICD-10-CM | POA: Insufficient documentation

## 2020-08-17 NOTE — Progress Notes (Signed)
New Obstetric Patient H&P   Chief Complaint: "Desires prenatal care"  History of Present Illness: Patient is a 34 y.o. G1P0 Not Hispanic or Latino female, LMP 06/11/2020 presents with amenorrhea and positive home pregnancy test. Based on her  LMP, her EDD is Estimated Date of Delivery: 03/18/2021 and her EGA is [redacted]w[redacted]d. She was taking a birth control pill and got pregnant while taking this, though she was planning on getting pregnant anyway.  Her last pap smear was 09/2018 and was no abnormalities.    She had a urine pregnancy test which was positive 3-4 weeks ago. Since her LMP she claims she has experienced nausea (no emesis);Marland Kitchen She denies vaginal bleeding. Her past medical history is noncontributory.   Since her LMP, she admits to the use of tobacco products  no She claims she has zero pounds since the start of her pregnancy.  There are cats in the home in the home  no  She admits close contact with children on a regular basis  yes  She has had chicken pox in the past yes She has had Tuberculosis exposures, symptoms, or previously tested positive for TB   no Current or past history of domestic violence. no  Genetic Screening/Teratology Counseling: (Includes patient, baby's father, or anyone in either family with:)   1. Patient's age >/= 32 at [facility]  no 2. Thalassemia ([place], [place], Mediterranean, or Asian background): MCV<80  no 3. Neural tube defect (meningomyelocele, spina bifida, anencephaly)  no 4. Congenital heart defect  no  5. Down syndrome  no 6. Tay-Sachs (Jewish, [place]  no 7. Canavan's Disease  no 8. Sickle cell disease or trait (African)  no  9. Hemophilia or other blood disorders  no  10. Muscular dystrophy  no  11. Cystic fibrosis  no  12. Huntington's Chorea  no  13. Mental retardation/autism  no 14. Other inherited genetic or chromosomal disorder  no 15. Maternal metabolic disorder (DM, PKU, etc)  DM (in MGM) DM in her dad.   16. Patient or FOB with a child  with a birth defect not listed above no  16a. Patient or FOB with a birth defect themselves no 17. Recurrent pregnancy loss, or stillbirth  no  18. Any medications since LMP other than prenatal vitamins (include vitamins, supplements, OTC meds, drugs, alcohol)  no 19. Any other genetic/environmental exposure to discuss  no  Infection History:   1. Lives with someone with TB or TB exposed  no  2. Patient or partner has history of genital herpes  no 3. Rash or viral illness since LMP  no 4. History of STI (GC, CT, HPV, syphilis, HIV)  no  Other pertinent information:  no     Review of Systems:10 point review of systems negative unless otherwise noted in HPI  Past Medical History:  Diagnosis Date  . No known health problems     Past Surgical History:  Procedure Laterality Date  . NO PAST SURGERIES      Gynecologic History: Patient's last menstrual period was 06/11/2020 (exact date).  Obstetric History: G1P0  Family History  Problem Relation Age of Onset  . Hypertension Mother   . Cancer Father   . Diabetes Maternal Grandmother   . Pancreatic cancer Maternal Grandmother     Social History   Socioeconomic History  . Marital status: Married    Spouse name: Not on file  . Number of children: 0  . Years of education: Not on file  . Highest education  level: Not on file  Occupational History  . Not on file  Tobacco Use  . Smoking status: Never Smoker  . Smokeless tobacco: Never Used  Vaping Use  . Vaping Use: Never used  Substance and Sexual Activity  . Alcohol use: Yes    Alcohol/week: 0.0 standard drinks    Comment: not during pregnancy  . Drug use: No  . Sexual activity: Yes    Birth control/protection: None  Other Topics Concern  . Not on file  Social History Narrative   Single; employed at [employer] part time [employer]   Social Determinants of Health   Financial Resource Strain:   . Difficulty of Paying Living Expenses: Not on file  Food  Insecurity:   . Worried About Programme researcher, broadcasting/film/video in the Last Year: Not on file  . Ran Out of Food in the Last Year: Not on file  Transportation Needs:   . Lack of Transportation (Medical): Not on file  . Lack of Transportation (Non-Medical): Not on file  Physical Activity:   . Days of Exercise per Week: Not on file  . Minutes of Exercise per Session: Not on file  Stress:   . Feeling of Stress : Not on file  Social Connections:   . Frequency of Communication with Friends and Family: Not on file  . Frequency of Social Gatherings with Friends and Family: Not on file  . Attends Religious Services: Not on file  . Active Member of Clubs or Organizations: Not on file  . Attends Banker Meetings: Not on file  . Marital Status: Not on file  Intimate Partner Violence:   . Fear of Current or Ex-Partner: Not on file  . Emotionally Abused: Not on file  . Physically Abused: Not on file  . Sexually Abused: Not on file   Allergies: No Known Allergies  Prior to Admission medications   PNV    Physical Exam BP 122/74   Wt (!) 304 lb (137.9 kg)   LMP 06/11/2020 (Exact Date)   BMI 46.22 kg/m   Physical Exam Constitutional:      General: She is not in acute distress.    Appearance: Normal appearance. She is well-developed.  Genitourinary:     Pelvic exam was performed with patient in the lithotomy position.     Vulva, inguinal canal, urethra, bladder, vagina, uterus, right adnexa and left adnexa normal.     No posterior fourchette tenderness, injury or lesion present.     No cervical friability, lesion, bleeding or polyp.  HENT:     Head: Normocephalic and atraumatic.  Eyes:     General: No scleral icterus.    Conjunctiva/sclera: Conjunctivae normal.  Cardiovascular:     Rate and Rhythm: Normal rate and regular rhythm.     Heart sounds: No murmur heard.  No friction rub. No gallop.   Pulmonary:     Effort: Pulmonary effort is normal. No respiratory distress.     Breath  sounds: Normal breath sounds. No wheezing or rales.  Abdominal:     General: Bowel sounds are normal. There is no distension.     Palpations: Abdomen is soft. There is no mass.     Tenderness: There is no abdominal tenderness. There is no guarding or rebound.  Musculoskeletal:        General: Normal range of motion.     Cervical back: Normal range of motion and neck supple.  Neurological:     General: No focal deficit present.  Mental Status: She is alert and oriented to person, place, and time.     Cranial Nerves: No cranial nerve deficit.  Skin:    General: Skin is warm and dry.     Findings: No erythema.  Psychiatric:        Mood and Affect: Mood normal.        Behavior: Behavior normal.        Judgment: Judgment normal.      Female Chaperone present during breast and/or pelvic exam.   Assessment: 34 y.o. G1P0 at [redacted]w[redacted]d presenting to initiate prenatal care  Plan: 1) Avoid alcoholic beverages. 2) Patient encouraged not to smoke.  3) Discontinue the use of all non-medicinal drugs and chemicals.  4) Take prenatal vitamins daily.  5) Nutrition, food safety (fish, cheese advisories, and high nitrite foods) and exercise discussed. 6) Hospital and practice style discussed with cross coverage system.  7) Genetic Screening, such as with 1st Trimester Screening, cell free fetal DNA, AFP testing, and Ultrasound, as well as with amniocentesis and CVS as appropriate, is discussed with patient. At the conclusion of today's visit patient undecided genetic testing 8) Patient is asked about travel to areas at risk for the [place] virus, and counseled to avoid travel and exposure to mosquitoes or sexual partners who may have themselves been exposed to the virus. Testing is discussed, and will be ordered as appropriate.  9) Obesity in pregnancy: will get NOB labs at next visit and will also get hemoglobin A1c. If suggestive of diabetes or insulin resistance, will get 1 hour glucose test.   Otherwise, routine protocol for BMI > 40.   Thomasene Mohair, MD 08/17/2020 4:19 PM

## 2020-08-23 ENCOUNTER — Encounter: Payer: Self-pay | Admitting: Obstetrics and Gynecology

## 2020-08-28 ENCOUNTER — Other Ambulatory Visit: Payer: Self-pay

## 2020-08-28 ENCOUNTER — Encounter: Payer: Self-pay | Admitting: Obstetrics & Gynecology

## 2020-08-28 ENCOUNTER — Other Ambulatory Visit: Payer: Self-pay | Admitting: Obstetrics and Gynecology

## 2020-08-28 ENCOUNTER — Ambulatory Visit (INDEPENDENT_AMBULATORY_CARE_PROVIDER_SITE_OTHER): Payer: BC Managed Care – PPO | Admitting: Obstetrics & Gynecology

## 2020-08-28 ENCOUNTER — Ambulatory Visit (INDEPENDENT_AMBULATORY_CARE_PROVIDER_SITE_OTHER): Payer: BC Managed Care – PPO

## 2020-08-28 VITALS — BP 110/70 | Wt 305.0 lb

## 2020-08-28 DIAGNOSIS — O99211 Obesity complicating pregnancy, first trimester: Secondary | ICD-10-CM

## 2020-08-28 DIAGNOSIS — Z23 Encounter for immunization: Secondary | ICD-10-CM

## 2020-08-28 DIAGNOSIS — O0990 Supervision of high risk pregnancy, unspecified, unspecified trimester: Secondary | ICD-10-CM | POA: Diagnosis not present

## 2020-08-28 DIAGNOSIS — Z3A09 9 weeks gestation of pregnancy: Secondary | ICD-10-CM

## 2020-08-28 DIAGNOSIS — Z3A12 12 weeks gestation of pregnancy: Secondary | ICD-10-CM

## 2020-08-28 DIAGNOSIS — O0991 Supervision of high risk pregnancy, unspecified, first trimester: Secondary | ICD-10-CM | POA: Diagnosis not present

## 2020-08-28 DIAGNOSIS — Z1379 Encounter for other screening for genetic and chromosomal anomalies: Secondary | ICD-10-CM

## 2020-08-28 NOTE — Progress Notes (Signed)
  Subjective  Fetal Movement? no Nausea? yes Leaking Fluid? no Vaginal Bleeding? no  Objective  BP 110/70   Wt (!) 305 lb (138.3 kg)   LMP 06/11/2020 (Exact Date)   BMI 46.38 kg/m  General: NAD Pumonary: no increased work of breathing Abdomen: gravid, non-tender Extremities: no edema Psychiatric: mood appropriate, affect full  Assessment  34 y.o. G1P0 at 28w3dby  03/09/2021, by Ultrasound presenting for routine prenatal visit  Plan   Problem List Items Addressed This Visit      Other   Supervision of high risk pregnancy, antepartum - Primary   Obesity affecting pregnancy    Baby ASA daily advised    Weight maintenance discussed; diet, exercise    Plan 28 week scan for growth   Relevant Orders   Glucose, 1 hour gestational - early testing planned nv   [redacted] weeks gestation of pregnancy       PNV     Flu shot today   Encounter for genetic screening for Down Syndrome       Relevant Orders   MaterniT21 PLUS Core+SCA     She also presents with a significant personal and/or family history of pancreatic. Details of which can be found in her medical/family history. She does not have a previously identified BRCA and Lynch syndrome mutation in her family. Due to her personal and/or family history of cancer she is a candidate for the [facility]).    Risk for cancer, genetic susceptibility discussed.  Patient has requested gene testing.  Discussed BRCA as well as Lynch syndrome and other cancer risk assessments available based on her family history and personal history. Pros and cons of testing discussed.     PBarnett Applebaum MD, FLou[facility] Pardon[facility]/Gyn, [facility] 08/28/2020  10:29 AM

## 2020-08-28 NOTE — Patient Instructions (Signed)
DUE DATE 03/09/2021  Commonly Asked Questions During Pregnancy  Cats: A parasite can be excreted in cat feces.  To avoid exposure you need to have another person empty the little box.  If you must empty the litter box you will need to wear gloves.  Wash your hands after handling your cat.  This parasite can also be found in raw or undercooked meat so this should also be avoided.  Colds, Sore Throats, Flu: Please check your medication sheet to see what you can take for symptoms.  If your symptoms are unrelieved by these medications please call the office.  Dental Work: Most any dental work Agricultural consultant recommends is permitted.  X-rays should only be taken during the first trimester if absolutely necessary.  Your abdomen should be shielded with a lead apron during all x-rays.  Please notify your provider prior to receiving any x-rays.  Novocaine is fine; gas is not recommended.  If your dentist requires a note from [place] prior to dental work please call the office and we will provide one for you.  Exercise: Exercise is an important part of staying healthy during your pregnancy.  You may continue most exercises you were accustomed to prior to pregnancy.  Later in your pregnancy you will most likely notice you have difficulty with activities requiring balance like riding a bicycle.  It is important that you listen to your body and avoid activities that put you at a higher risk of falling.  Adequate rest and staying well hydrated are a must!  If you have questions about the safety of specific activities ask your provider.    Exposure to Children with illness: Try to avoid obvious exposure; report any symptoms to [place] when noted,  If you have chicken pos, red measles or mumps, you should be immune to these diseases.   Please do not take any vaccines while pregnant unless you have checked with your OB provider.  Fetal Movement: After 28 weeks we recommend you do "kick counts" twice daily.  Lie or sit down in a calm  quiet environment and count your baby movements "kicks".  You should feel your baby at least 10 times per hour.  If you have not felt 10 kicks within the first hour get up, walk around and have something sweet to eat or drink then repeat for an additional hour.  If count remains less than 10 per hour notify your provider.  Fumigating: Follow your pest control agent's advice as to how long to stay out of your home.  Ventilate the area well before re-entering.  Hemorrhoids:   Most over-the-counter preparations can be used during pregnancy.  Check your medication to see what is safe to use.  It is important to use a stool softener or fiber in your diet and to drink lots of liquids.  If hemorrhoids seem to be getting worse please call the office.   Hot Tubs:  Hot tubs Jacuzzis and saunas are not recommended while pregnant.  These increase your internal body temperature and should be avoided.  Intercourse:  Sexual intercourse is safe during pregnancy as long as you are comfortable, unless otherwise advised by your provider.  Spotting may occur after intercourse; report any bright red bleeding that is heavier than spotting.  Labor:  If you know that you are in labor, please go to the hospital.  If you are unsure, please call the office and let us help you decide what to do.  Lifting, straining, etc:  If  your job requires heavy lifting or straining please check with your provider for any limitations.  Generally, you should not lift items heavier than that you can lift simply with your hands and arms (no back muscles)  Painting:  Paint fumes do not harm your pregnancy, but may make you ill and should be avoided if possible.  Latex or water based paints have less odor than oils.  Use adequate ventilation while painting.  Permanents & Hair Color:  Chemicals in hair dyes are not recommended as they cause increase hair dryness which can increase hair loss during pregnancy.  " Highlighting" and permanents are  allowed.  Dye may be absorbed differently and permanents may not hold as well during pregnancy.  Sunbathing:  Use a sunscreen, as skin burns easily during pregnancy.  Drink plenty of fluids; avoid over heating.  Tanning Beds:  Because their possible side effects are still unknown, tanning beds are not recommended.  Ultrasound Scans:  Routine ultrasounds are performed at approximately 20 weeks.  You will be able to see your baby's general anatomy an if you would like to know the gender this can usually be determined as well.  If it is questionable when you conceived you may also receive an ultrasound early in your pregnancy for dating purposes.  Otherwise ultrasound exams are not routinely performed unless there is a medical necessity.  Although you can request a scan we ask that you pay for it when conducted because insurance does not cover " patient request" scans.  Work: If your pregnancy proceeds without complications you may work until your due date, unless your physician or employer advises otherwise.  Round Ligament Pain/Pelvic Discomfort:  Sharp, shooting pains not associated with bleeding are fairly common, usually occurring in the second trimester of pregnancy.  They tend to be worse when standing up or when you remain standing for long periods of time.  These are the result of pressure of certain pelvic ligaments called "round ligaments".  Rest, Tylenol and heat seem to be the most effective relief.  As the womb and fetus grow, they rise out of the pelvis and the discomfort improves.  Please notify the office if your pain seems different than that described.  It may represent a more serious condition.

## 2020-08-29 LAB — TSH+FREE T4
Free T4: 1.03 ng/dL (ref 0.82–1.77)
TSH: 1.32 u[IU]/mL (ref 0.450–4.500)

## 2020-08-29 LAB — HGB A1C W/O EAG: Hgb A1c MFr Bld: 5.9 % — ABNORMAL HIGH (ref 4.8–5.6)

## 2020-08-29 LAB — RPR+RH+ABO+RUB AB+AB SCR+CB...
Antibody Screen: NEGATIVE
HIV Screen 4th Generation wRfx: NONREACTIVE
Hematocrit: 42.1 % (ref 34.0–46.6)
Hemoglobin: 14.5 g/dL (ref 11.1–15.9)
Hepatitis B Surface Ag: NEGATIVE
MCH: 33.7 pg — ABNORMAL HIGH (ref 26.6–33.0)
MCHC: 34.4 g/dL (ref 31.5–35.7)
MCV: 98 fL — ABNORMAL HIGH (ref 79–97)
Platelets: 321 10*3/uL (ref 150–450)
RBC: 4.3 x10E6/uL (ref 3.77–5.28)
RDW: 12.5 % (ref 11.7–15.4)
RPR Ser Ql: NONREACTIVE
Rh Factor: POSITIVE
Rubella Antibodies, IGG: <0.9 index — ABNORMAL LOW (ref >0.99)
Varicella zoster IgG: 973 index (ref >165)
WBC: 6.8 10*3/uL (ref 3.4–10.8)

## 2020-08-29 LAB — URINE DRUG PANEL 7
Amphetamines, Urine: NEGATIVE ng/mL
Barbiturate Quant, Ur: NEGATIVE ng/mL
Benzodiazepine Quant, Ur: NEGATIVE ng/mL
Cannabinoid Quant, Ur: NEGATIVE ng/mL
Cocaine (Metab.): NEGATIVE ng/mL
Opiate Quant, Ur: NEGATIVE ng/mL
PCP Quant, Ur: NEGATIVE ng/mL

## 2020-08-30 LAB — URINE CULTURE

## 2020-08-31 LAB — HGB FRACTIONATION CASCADE
Hgb A2: 3 % (ref 1.8–3.2)
Hgb A: 97 % (ref 96.4–98.8)
Hgb F: 0 % (ref 0.0–2.0)
Hgb S: 0 %

## 2020-09-04 LAB — MATERNIT21 PLUS CORE+SCA
Fetal Fraction: 9
Monosomy X (Turner Syndrome): NOT DETECTED
Result (T21): NEGATIVE
Trisomy 13 (Patau syndrome): NEGATIVE
Trisomy 18 (Edwards syndrome): NEGATIVE
Trisomy 21 (Down syndrome): NEGATIVE
XXX (Triple X Syndrome): NOT DETECTED
XXY (Klinefelter Syndrome): NOT DETECTED
XYY (Jacobs Syndrome): NOT DETECTED

## 2020-09-11 ENCOUNTER — Encounter: Payer: Self-pay | Admitting: Obstetrics and Gynecology

## 2020-09-12 ENCOUNTER — Other Ambulatory Visit: Payer: Self-pay | Admitting: Obstetrics and Gynecology

## 2020-09-12 DIAGNOSIS — Z800 Family history of malignant neoplasm of digestive organs: Secondary | ICD-10-CM

## 2020-09-12 NOTE — Progress Notes (Signed)
Orders placed for [facility] testing done on 08/28/20

## 2020-09-13 ENCOUNTER — Telehealth: Payer: Self-pay

## 2020-09-13 NOTE — Telephone Encounter (Signed)
Alanna w/[facility] calling to report this patients results came back with special interpretation. Inquiring if we have any questions about these results. 

## 2020-09-17 NOTE — Telephone Encounter (Signed)
Spoke with Morgan Grant at [facility] to clarify special interpretation. Pt still requires same med mgmt of BRCA 2; however, her risk of the cancers is at the lower end of the % scale since it is a lower penetrance variant. Info relayed to Dr. Kenton Kingfisher.

## 2020-09-25 ENCOUNTER — Ambulatory Visit (INDEPENDENT_AMBULATORY_CARE_PROVIDER_SITE_OTHER): Payer: BC Managed Care – PPO | Admitting: Obstetrics & Gynecology

## 2020-09-25 ENCOUNTER — Encounter: Payer: Self-pay | Admitting: Obstetrics & Gynecology

## 2020-09-25 ENCOUNTER — Other Ambulatory Visit: Payer: BC Managed Care – PPO

## 2020-09-25 ENCOUNTER — Other Ambulatory Visit: Payer: Self-pay

## 2020-09-25 VITALS — BP 118/78 | Wt 304.0 lb

## 2020-09-25 DIAGNOSIS — Z3689 Encounter for other specified antenatal screening: Secondary | ICD-10-CM

## 2020-09-25 DIAGNOSIS — Z3A16 16 weeks gestation of pregnancy: Secondary | ICD-10-CM | POA: Diagnosis not present

## 2020-09-25 DIAGNOSIS — O99212 Obesity complicating pregnancy, second trimester: Secondary | ICD-10-CM

## 2020-09-25 DIAGNOSIS — Z1501 Genetic susceptibility to malignant neoplasm of breast: Secondary | ICD-10-CM | POA: Diagnosis not present

## 2020-09-25 DIAGNOSIS — O0992 Supervision of high risk pregnancy, unspecified, second trimester: Secondary | ICD-10-CM

## 2020-09-25 DIAGNOSIS — O99211 Obesity complicating pregnancy, first trimester: Secondary | ICD-10-CM

## 2020-09-25 DIAGNOSIS — Z1509 Genetic susceptibility to other malignant neoplasm: Secondary | ICD-10-CM

## 2020-09-25 DIAGNOSIS — O285 Abnormal chromosomal and genetic finding on antenatal screening of mother: Secondary | ICD-10-CM | POA: Diagnosis not present

## 2020-09-25 NOTE — Patient Instructions (Signed)
BRCA Gene Testing Why am I having this test? BRCA gene testing is done to check for the presence of harmful changes (mutations) in the BRCA1 gene or the BRCA2 gene (breast cancer susceptibility genes). If there is a mutation, the genes may not be able to help repair damaged cells in the body. As a result, the damaged cells may develop defects that can lead to certain types of cancer. You may have this test if you have a family history of certain types of cancer, including cancer of the:  Breast.  Ovaries.  Fallopian tubes.  Peritoneum.  Pancreas.  Prostate. What kind of sample is taken?     The test requires either a sample of blood or a sample of cells from your saliva. If a sample of blood is needed, it will probably be collected by inserting a needle into a vein. If a sample of saliva is needed, you will get instructions about how to collect the sample. What do the results mean? The test results can show whether you have a mutation in the BRCA1 or BRCA2 gene that increases your risk for certain cancers. Meaning of negative test results A negative test result means that you do not have a mutation in the BRCA1 or BRCA2 gene that is known to increase your risk for certain cancers. This does not mean that you will never get cancer. Talk with your health care provider or a genetic counselor about what this result means for you. Meaning of positive test results A positive test result means that you have a mutation in the BRCA1 or BRCA2 gene that increases your risk for certain cancers. Women with a positive test result have an increased risk for breast and ovarian cancer. Both women and men with a mutation have an increased risk for breast cancer and may be at greater risk for other types of cancer. Getting a positive test result does not mean that you will develop cancer. Talk with your health care provider or a genetic counselor about what this result means for you. You may be told that you  are a carrier. This means that you can pass the mutation to your children. Meaning of ambiguous test results Ambiguous, inconclusive, or uncertain test results mean that there is a change in the BRCA1 or BRCA2 gene, but it is a change that has not been linked to cancer. Talk with your health care provider or a genetic counselor about what this result means for you. Talk with your health care provider to discuss your results, treatment options, and if necessary, the need for more tests. Talk with your health care provider if you have any questions about your results. How do I get my results? It is up to you to get your test results. Ask your health care provider, or the department that is doing the test, when your results will be ready. This information is not intended to replace advice given to you by your health care provider. Make sure you discuss any questions you have with your health care provider. Document Revised: 06/28/2018 Document Reviewed: 07/23/2016 Elsevier Patient Education  2020 Elsevier Inc.  

## 2020-09-25 NOTE — Progress Notes (Signed)
  History of Present Illness:  Morgan Grant is a 34 y.o. who recently underwent labwork for genetic testing due to [place] Pancreatic cancer.  She presents for discussion of results and plan of management. 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  Also, she is [redacted] weeks pregnant.  No concerning sx's.  Min nausea.  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 Also,  has a past surgical history that includes No past surgeries., family history includes Diabetes in her maternal grandmother; Hypertension in her mother; Pancreatic cancer (age of onset: 62) in her father; Pancreatic cancer (age of onset: 75) in her maternal grandmother.,  reports that she has never smoked. She has never used smokeless tobacco. She reports current alcohol use. She reports that she does not use drugs. No outpatient medications have been marked as taking for the 09/25/20 encounter (Routine Prenatal) with Gae Dry, MD.  . Also, has No Known Allergies..  Review of Systems  All other systems reviewed and are negative.   Physical Exam:  BP 118/78   Wt (!) 304 lb (137.9 kg)   LMP 06/11/2020 (Exact Date)   BMI 46.22 kg/m  Body mass index is 46.22 kg/m. Constitutional: Well nourished, well developed female in no acute distress.  Abdomen: diffusely non tender to palpation, non distended, and no masses, hernias Neuro: Grossly intact Psych:  Normal mood and affect.    Assessment:  Problem List Items Addressed This Visit      Other   Supervision of high risk pregnancy, antepartum   Obesity affecting pregnancy   BRCA gene mutation positive    Other Visit Diagnoses    [redacted] weeks gestation of pregnancy    -  Primary   Screening, antenatal, for fetal anatomic survey       Relevant Orders   US OB Comp + 14 Wk      Plan: Detailed discussion of results today. Options  for management discussed. Info provided. At this time, we will plan for genetic counseling thru [facility] online counselor.  Will consider [facility] at later time, perhaps posr partum.Marland Kitchen She was amenable to this plan and we will see her back for annual/PRN.  Cont pregnancy care. Ant [place] nv. Consideration for OCP in future.  Also BSO.  Other risk reducing strategies counseled today.  Also increased surveillance measures.  A total of 30 minutes were spent face-to-face with the patient as well as preparation, review, communication, and documentation during this encounter.    Barnett Applebaum, MD, [facility], [facility] 09/25/2020  9:54 AM

## 2020-09-26 LAB — GLUCOSE, 1 HOUR GESTATIONAL: Gestational Diabetes Screen: 83 mg/dL (ref 65–139)

## 2020-10-19 ENCOUNTER — Telehealth: Payer: Self-pay

## 2020-10-19 NOTE — Telephone Encounter (Signed)
Pt calling to see what she needed to do to prepare for anatomy scan.  [phone]  Left detailed msg, per ultrasonographer, no preparation needed.

## 2020-10-23 ENCOUNTER — Ambulatory Visit (INDEPENDENT_AMBULATORY_CARE_PROVIDER_SITE_OTHER): Payer: BC Managed Care – PPO | Admitting: Obstetrics & Gynecology

## 2020-10-23 ENCOUNTER — Encounter: Payer: Self-pay | Admitting: Obstetrics & Gynecology

## 2020-10-23 ENCOUNTER — Ambulatory Visit (INDEPENDENT_AMBULATORY_CARE_PROVIDER_SITE_OTHER): Payer: BC Managed Care – PPO

## 2020-10-23 ENCOUNTER — Other Ambulatory Visit: Payer: Self-pay

## 2020-10-23 VITALS — BP 130/80 | Wt 306.0 lb

## 2020-10-23 DIAGNOSIS — Z3689 Encounter for other specified antenatal screening: Secondary | ICD-10-CM | POA: Diagnosis not present

## 2020-10-23 DIAGNOSIS — Z3A20 20 weeks gestation of pregnancy: Secondary | ICD-10-CM

## 2020-10-23 DIAGNOSIS — O99212 Obesity complicating pregnancy, second trimester: Secondary | ICD-10-CM

## 2020-10-23 DIAGNOSIS — O0992 Supervision of high risk pregnancy, unspecified, second trimester: Secondary | ICD-10-CM

## 2020-10-23 DIAGNOSIS — Z131 Encounter for screening for diabetes mellitus: Secondary | ICD-10-CM

## 2020-10-23 NOTE — Patient Instructions (Signed)
Thank you for choosing [facility]. As part of our ongoing efforts to improve patient experience, we would appreciate your feedback. Please fill out the short survey that you will receive by mail or MyChart. Your opinion is important to us! -Dr Xayla Puzio  Prenatal Ultrasound A prenatal ultrasound exam, also called a sonogram, is an imaging test that allows your health care provider to see your baby and placenta in the uterus. This is a safe and painless test that does not expose you or your baby to any X-rays, needles, or medicines. Prenatal ultrasounds are done using a handheld plastic device (transducer) that sends out sound waves (ultrasound). The sound waves reflect off your baby's bones and other tissues to create moving images on a computer screen. There are two types of prenatal ultrasound:  Transabdominal ultrasound. During this test, a transducer is placed on your belly and moved around. A routine transabdominal ultrasound is usually done between weeks 18 and 22 of pregnancy (standard ultrasound). It may also be done between weeks 13 and 14.  Transvaginal ultrasound. During this test, a transducer that is shaped like a wand is placed inside your vagina. This type of ultrasound is usually done during early pregnancy. Prenatal ultrasounds may be used to check:  How far along your pregnancy is (stage).  Your baby's development (gestational age).  The location and condition of the organ that supplies your baby with nourishment and oxygen (placenta).  Your baby's heart rate, position, and movements.  Your baby's approximate size and weight.  The amount of fluid surrounding your baby (amniotic fluid).  If you are carrying more than one baby.  Your baby's sex (if your baby is in a position that allows the sex organs to be seen, and if you choose to learn the sex at this time).  If there are any possible problems that require more testing, such as genetic problems.  If your pregnancy  is forming outside your uterus (ectopic pregnancy). You may have other ultrasounds as needed at any point during your pregnancy. If your health care provider suspects a problem, you may also have a more detailed type of transabdominal ultrasound (advanced ultrasound). What are the risks? Generally, this is a safe test. There are no known risks for you or your baby from a prenatal ultrasound. What happens before the test?  Before a transabdominal ultrasound, you may be asked to drink fluid 2 hours before the exam and avoid emptying your bladder. A full bladder helps the images show up more clearly.  Before a transvaginal ultrasound, you may be asked to empty your bladder before the exam.  Wear loose, comfortable clothing so it is easy to undress or expose your lower belly for the exam. What happens during the test? If you are having a transabdominal ultrasound:  You will lie on an exam table.  Your belly will be exposed.  Gel will be rubbed over your belly.  The transducer will be pressed on your belly and moved back and forth, through the gel. You may feel slight pressure, but there should not be any pain.  You may be asked to change your position.  You may hear sounds of blood flow and your baby's heartbeat. You may be able to see images of your baby on the computer screen. Your health care provider may measure your baby's head and other body parts, looking for normal development.  After the exam, the gel will be cleaned off, and you can replace your clothing. You will be   able to empty your bladder after the exam is done. If you are having a transvaginal ultrasound:  You will change into a hospital gown or undress from the waist down and cover yourself with a paper sheet.  You will lie down on an exam table with your feet in footrests (stirrups).  The transducer will be covered with a protective cover and lubricated.  The transducer will be inserted into your vagina.  You may  hear sounds of blood flow and your baby's heartbeat. You may be able to see images of your baby on the computer screen.  After the exam, the transducer will be removed, and you can put your clothes back on. What can I expect after the test?  You can drive yourself home and return to all your normal activities.  A health care provider trained in interpreting ultrasounds will review the images taken during your exam and send a report to your health care provider.  It is up to you to get your test results. Ask your health care provider, or the department that is doing the test, when your results will be ready. Questions to ask your health care provider  Why am I having this prenatal ultrasound?  What information will this exam provide?  How much does this exam cost? What costs will my insurance cover?  Can my partner or support person be with me during the exam?  When can I expect to get the results? Summary  A prenatal ultrasound is a safe and painless imaging exam that gives information about your pregnancy and your developing baby.  Transvaginal ultrasound exams are often done in early pregnancy. Standard transabdominal ultrasounds are typically done between 18 and 22 weeks of pregnancy. You may have other prenatal ultrasounds as needed.  This exam has no risks for you or your baby. After the exam, you can go home and return to all your usual activities. This information is not intended to replace advice given to you by your health care provider. Make sure you discuss any questions you have with your health care provider. Document Revised: 03/25/2019 Document Reviewed: 02/03/2018 Elsevier Patient Education  2020 Elsevier Inc.  

## 2020-10-23 NOTE — Progress Notes (Signed)
  HPI: Pt is [redacted] weeks pregnant and has mild GERD but no other sx's at this time.  Occas flutter of FM.  Ultrasound demonstrates measurements of singleton IUP.  See below.  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 Also,  has a past surgical history that includes No past surgeries., family history includes Diabetes in her maternal grandmother; Hypertension in her mother; Pancreatic cancer (age of onset: 53) in her father; Pancreatic cancer (age of onset: 39) in her maternal grandmother.,  reports that she has never smoked. She has never used smokeless tobacco. She reports current alcohol use. She reports that she does not use drugs.  She currently has no medications in their medication list. Also, has No Known Allergies.  ROS, neg except as above  Objective: BP 130/80   Wt (!) 306 lb (138.8 kg)   LMP 06/11/2020 (Exact Date)   BMI 46.53 kg/m   Physical examination Constitutional NAD, Conversant  Skin No rashes, lesions or ulceration.   Extremities: Moves all appropriately.  Normal ROM for age. No lymphadenopathy.  Neuro: Grossly intact  Psych: Oriented to PPT.  Normal mood. Normal affect.   US OB Comp + 14 Wk  Result Date: 10/23/2020 Patient Name: Morgan Grant DOB: 05/04/1986 MRN: [id number] ULTRASOUND REPORT Location: [facility] Date of Service: 10/23/2020 Indications:Anatomy Ultrasound Findings: Morgan Grant intrauterine pregnancy is visualized with positive fetal heartbeat. Biometrics give an (U/S) Gestational age of 28w0dand an (U/S) EDD of 03/12/2021; this correlates with the clinically established Estimated Date of Delivery: 03/09/21 Fetal presentation is Breech. EFW: 337 g ( 12 oz ). Placenta: anterior. Grade: 1 AFI: subjectively normal. Anatomic survey is incomplete for ACI, RVOT, 3VV, 3VC and normal; Gender - female.  Impression: 1. 256w3dViable Singleton Intrauterine pregnancy by U/S. 2. (U/S) EDD is consistent with Clinically established Estimated Date of Delivery: 03/09/21 . 3. Normal Anatomy Scan Recommendations: 1.Clinical correlation with the patient's History and Physical Exam. ElGweneth DimitriRT Review of ULTRASOUND. I have personally reviewed images and [facility] with subjectively normal amniotic fluid volume. The fetal biometry correlates with established dating. Detailed evaluation of the fetal anatomy was performed.The fetal anatomical survey appears within normal limits within the resolution of ultrasound as described above.  It must be noted that a normal ultrasound is unable to rule out fetal aneuploidy.  PaBarnett ApplebaumMD, FABunk Fossb/Gyn, [facility] 10/23/2020  9:55 AM    Assessment:  Supervision of high risk pregnancy in second trimester - PNV Obesity affecting pregnancy in second trimester - ASA, APT [redacted] weeks gestation of pregnancy  Screening for diabetes mellitus - Plan: 28 Week RH+Panel - at 28 weeks Screening, antenatal, for fetal anatomic survey - Plan: [place] Follow Up - needs f/u; otherwise reassuring today  A total of 20 minutes were spent face-to-face with the patient as well as preparation, review, communication, and documentation during this encounter.   PaBarnett ApplebaumMD, FALoura Pardonb/Gyn, [facility] 10/23/2020  10:04 AM

## 2020-11-21 ENCOUNTER — Other Ambulatory Visit: Payer: Self-pay

## 2020-11-21 ENCOUNTER — Ambulatory Visit (INDEPENDENT_AMBULATORY_CARE_PROVIDER_SITE_OTHER): Payer: BC Managed Care – PPO

## 2020-11-21 ENCOUNTER — Encounter: Payer: Self-pay | Admitting: Obstetrics and Gynecology

## 2020-11-21 ENCOUNTER — Ambulatory Visit (INDEPENDENT_AMBULATORY_CARE_PROVIDER_SITE_OTHER): Payer: BC Managed Care – PPO | Admitting: Obstetrics and Gynecology

## 2020-11-21 VITALS — BP 110/70 | Ht 69.0 in | Wt 309.0 lb

## 2020-11-21 DIAGNOSIS — O0992 Supervision of high risk pregnancy, unspecified, second trimester: Secondary | ICD-10-CM

## 2020-11-21 DIAGNOSIS — O99212 Obesity complicating pregnancy, second trimester: Secondary | ICD-10-CM

## 2020-11-21 DIAGNOSIS — Z9189 Other specified personal risk factors, not elsewhere classified: Secondary | ICD-10-CM

## 2020-11-21 DIAGNOSIS — Z3689 Encounter for other specified antenatal screening: Secondary | ICD-10-CM

## 2020-11-21 DIAGNOSIS — Z3A24 24 weeks gestation of pregnancy: Secondary | ICD-10-CM | POA: Diagnosis not present

## 2020-11-21 DIAGNOSIS — O0990 Supervision of high risk pregnancy, unspecified, unspecified trimester: Secondary | ICD-10-CM

## 2020-11-21 NOTE — Patient Instructions (Addendum)
Second Trimester of Pregnancy The second trimester is from week 14 through week 27 (months 4 through 6). The second trimester is often a time when you feel your best. Your body has adjusted to being pregnant, and you begin to feel better physically. Usually, morning sickness has lessened or quit completely, you may have more energy, and you may have an increase in appetite. The second trimester is also a time when the fetus is growing rapidly. At the end of the sixth month, the fetus is about 9 inches long and weighs about 1 pounds. You will likely begin to feel the baby move (quickening) between 16 and 20 weeks of pregnancy. Body changes during your second trimester Your body continues to go through many changes during your second trimester. The changes vary from woman to woman.  Your weight will continue to increase. You will notice your lower abdomen bulging out.  You may begin to get stretch marks on your hips, abdomen, and breasts.  You may develop headaches that can be relieved by medicines. The medicines should be approved by your health care provider.  You may urinate more often because the fetus is pressing on your bladder.  You may develop or continue to have heartburn as a result of your pregnancy.  You may develop constipation because certain hormones are causing the muscles that push waste through your intestines to slow down.  You may develop hemorrhoids or swollen, bulging veins (varicose veins).  You may have back pain. This is caused by: ? Weight gain. ? Pregnancy hormones that are relaxing the joints in your pelvis. ? A shift in weight and the muscles that support your balance.  Your breasts will continue to grow and they will continue to become tender.  Your gums may bleed and may be sensitive to brushing and flossing.  Dark spots or blotches (chloasma, mask of pregnancy) may develop on your face. This will likely fade after the baby is born.  A dark line from your  belly button to the pubic area (linea nigra) may appear. This will likely fade after the baby is born.  You may have changes in your hair. These can include thickening of your hair, rapid growth, and changes in texture. Some women also have hair loss during or after pregnancy, or hair that feels dry or thin. Your hair will most likely return to normal after your baby is born. What to expect at prenatal visits During a routine prenatal visit:  You will be weighed to make sure you and the fetus are growing normally.  Your blood pressure will be taken.  Your abdomen will be measured to track your baby's growth.  The fetal heartbeat will be listened to.  Any test results from the previous visit will be discussed. Your health care provider may ask you:  How you are feeling.  If you are feeling the baby move.  If you have had any abnormal symptoms, such as leaking fluid, bleeding, severe headaches, or abdominal cramping.  If you are using any tobacco products, including cigarettes, chewing tobacco, and electronic cigarettes.  If you have any questions. Other tests that may be performed during your second trimester include:  Blood tests that check for: ? Low iron levels (anemia). ? High blood sugar that affects pregnant women (gestational diabetes) between 24 and 28 weeks. ? Rh antibodies. This is to check for a protein on red blood cells (Rh factor).  Urine tests to check for infections, diabetes, or protein in the   urine.  An ultrasound to confirm the proper growth and development of the baby.  An amniocentesis to check for possible genetic problems.  Fetal screens for spina bifida and Down syndrome.  HIV (human immunodeficiency virus) testing. Routine prenatal testing includes screening for HIV, unless you choose not to have this test. Follow these instructions at home: Medicines  Follow your health care provider's instructions regarding medicine use. Specific medicines may be  either safe or unsafe to take during pregnancy.  Take a prenatal vitamin that contains at least 600 micrograms (mcg) of folic acid.  If you develop constipation, try taking a stool softener if your health care provider approves. Eating and drinking   Eat a balanced diet that includes fresh fruits and vegetables, whole grains, good sources of protein such as meat, eggs, or tofu, and low-fat dairy. Your health care provider will help you determine the amount of weight gain that is right for you.  Avoid raw meat and uncooked cheese. These carry germs that can cause birth defects in the baby.  If you have low calcium intake from food, talk to your health care provider about whether you should take a daily calcium supplement.  Limit foods that are high in fat and processed sugars, such as fried and sweet foods.  To prevent constipation: ? Drink enough fluid to keep your urine clear or pale yellow. ? Eat foods that are high in fiber, such as fresh fruits and vegetables, whole grains, and beans. Activity  Exercise only as directed by your health care provider. Most women can continue their usual exercise routine during pregnancy. Try to exercise for 30 minutes at least 5 days a week. Stop exercising if you experience uterine contractions.  Avoid heavy lifting, wear low heel shoes, and practice good posture.  A sexual relationship may be continued unless your health care provider directs you otherwise. Relieving pain and discomfort  Wear a good support bra to prevent discomfort from breast tenderness.  Take warm sitz baths to soothe any pain or discomfort caused by hemorrhoids. Use hemorrhoid cream if your health care provider approves.  Rest with your legs elevated if you have leg cramps or low back pain.  If you develop varicose veins, wear support hose. Elevate your feet for 15 minutes, 3-4 times a day. Limit salt in your diet. Prenatal Care  Write down your questions. Take them to  your prenatal visits.  Keep all your prenatal visits as told by your health care provider. This is important. Safety  Wear your seat belt at all times when driving.  Make a list of emergency phone numbers, including numbers for family, friends, the hospital, and police and fire departments. General instructions  Ask your health care provider for a referral to a local prenatal education class. Begin classes no later than the beginning of month 6 of your pregnancy.  Ask for help if you have counseling or nutritional needs during pregnancy. Your health care provider can offer advice or refer you to specialists for help with various needs.  Do not use hot tubs, steam rooms, or saunas.  Do not douche or use tampons or scented sanitary pads.  Do not cross your legs for long periods of time.  Avoid cat litter boxes and soil used by cats. These carry germs that can cause birth defects in the baby and possibly loss of the fetus by miscarriage or stillbirth.  Avoid all smoking, herbs, alcohol, and unprescribed drugs. Chemicals in these products can affect the formation   and growth of the baby.  Do not use any products that contain nicotine or tobacco, such as cigarettes and e-cigarettes. If you need help quitting, ask your health care provider.  Visit your dentist if you have not gone yet during your pregnancy. Use a soft toothbrush to brush your teeth and be gentle when you floss. Contact a health care provider if:  You have dizziness.  You have mild pelvic cramps, pelvic pressure, or nagging pain in the abdominal area.  You have persistent nausea, vomiting, or diarrhea.  You have a bad smelling vaginal discharge.  You have pain when you urinate. Get help right away if:  You have a fever.  You are leaking fluid from your vagina.  You have spotting or bleeding from your vagina.  You have severe abdominal cramping or pain.  You have rapid weight gain or weight loss.  You have  shortness of breath with chest pain.  You notice sudden or extreme swelling of your face, hands, ankles, feet, or legs.  You have not felt your baby move in over an hour.  You have severe headaches that do not go away when you take medicine.  You have vision changes. Summary  The second trimester is from week 14 through week 27 (months 4 through 6). It is also a time when the fetus is growing rapidly.  Your body goes through many changes during pregnancy. The changes vary from woman to woman.  Avoid all smoking, herbs, alcohol, and unprescribed drugs. These chemicals affect the formation and growth your baby.  Do not use any tobacco products, such as cigarettes, chewing tobacco, and e-cigarettes. If you need help quitting, ask your health care provider.  Contact your health care provider if you have any questions. Keep all prenatal visits as told by your health care provider. This is important. This information is not intended to replace advice given to you by your health care provider. Make sure you discuss any questions you have with your health care provider. Document Revised: 03/25/2019 Document Reviewed: 01/06/2017 Elsevier Patient Education  2020 Elsevier Inc.   Sleep Apnea Sleep apnea is a condition in which breathing pauses or becomes shallow during sleep. Episodes of sleep apnea usually last 10 seconds or longer, and they may occur as many as 20 times an hour. Sleep apnea disrupts your sleep and keeps your body from getting the rest that it needs. This condition can increase your risk of certain health problems, including:  Heart attack.  Stroke.  Obesity.  Diabetes.  Heart failure.  Irregular heartbeat. What are the causes? There are three kinds of sleep apnea:  Obstructive sleep apnea. This kind is caused by a blocked or collapsed airway.  Central sleep apnea. This kind happens when the part of the brain that controls breathing does not send the correct signals  to the muscles that control breathing.  Mixed sleep apnea. This is a combination of obstructive and central sleep apnea. The most common cause of this condition is a collapsed or blocked airway. An airway can collapse or become blocked if:  Your throat muscles are abnormally relaxed.  Your tongue and tonsils are larger than normal.  You are overweight.  Your airway is smaller than normal. What increases the risk? You are more likely to develop this condition if you:  Are overweight.  Smoke.  Have a smaller than normal airway.  Are elderly.  Are female.  Drink alcohol.  Take sedatives or tranquilizers.  Have a family history  of sleep apnea. What are the signs or symptoms? Symptoms of this condition include:  Trouble staying asleep.  Daytime sleepiness and tiredness.  Irritability.  Loud snoring.  Morning headaches.  Trouble concentrating.  Forgetfulness.  Decreased interest in sex.  Unexplained sleepiness.  Mood swings.  Personality changes.  Feelings of depression.  Waking up often during the night to urinate.  Dry mouth.  Sore throat. How is this diagnosed? This condition may be diagnosed with:  A medical history.  A physical exam.  A series of tests that are done while you are sleeping (sleep study). These tests are usually done in a sleep lab, but they may also be done at home. How is this treated? Treatment for this condition aims to restore normal breathing and to ease symptoms during sleep. It may involve managing health issues that can affect breathing, such as high blood pressure or obesity. Treatment may include:  Sleeping on your side.  Using a decongestant if you have nasal congestion.  Avoiding the use of depressants, including alcohol, sedatives, and narcotics.  Losing weight if you are overweight.  Making changes to your diet.  Quitting smoking.  Using a device to open your airway while you sleep, such as: ? An oral  appliance. This is a custom-made mouthpiece that shifts your lower jaw forward. ? A continuous positive airway pressure (CPAP) device. This device blows air through a mask when you breathe out (exhale). ? A nasal expiratory positive airway pressure (EPAP) device. This device has valves that you put into each nostril. ? A bi-level positive airway pressure (BPAP) device. This device blows air through a mask when you breathe in (inhale) and breathe out (exhale).  Having surgery if other treatments do not work. During surgery, excess tissue is removed to create a wider airway. It is important to get treatment for sleep apnea. Without treatment, this condition can lead to:  High blood pressure.  Coronary artery disease.  In men, an inability to achieve or maintain an erection (impotence).  Reduced thinking abilities. Follow these instructions at home: Lifestyle  Make any lifestyle changes that your health care provider recommends.  Eat a healthy, well-balanced diet.  Take steps to lose weight if you are overweight.  Avoid using depressants, including alcohol, sedatives, and narcotics.  Do not use any products that contain nicotine or tobacco, such as cigarettes, e-cigarettes, and chewing tobacco. If you need help quitting, ask your health care provider. General instructions  Take over-the-counter and prescription medicines only as told by your health care provider.  If you were given a device to open your airway while you sleep, use it only as told by your health care provider.  If you are having surgery, make sure to tell your health care provider you have sleep apnea. You may need to bring your device with you.  Keep all follow-up visits as told by your health care provider. This is important. Contact a health care provider if:  The device that you received to open your airway during sleep is uncomfortable or does not seem to be working.  Your symptoms do not improve.  Your  symptoms get worse. Get help right away if:  You develop: ? Chest pain. ? Shortness of breath. ? Discomfort in your back, arms, or stomach.  You have: ? Trouble speaking. ? Weakness on one side of your body. ? Drooping in your face. These symptoms may represent a serious problem that is an emergency. Do not wait to see if  the symptoms will go away. Get medical help right away. Call your local emergency services (911 in the [place] Do not drive yourself to the hospital. Summary  Sleep apnea is a condition in which breathing pauses or becomes shallow during sleep.  The most common cause is a collapsed or blocked airway.  The goal of treatment is to restore normal breathing and to ease symptoms during sleep. This information is not intended to replace advice given to you by your health care provider. Make sure you discuss any questions you have with your health care provider. Document Revised: 05/18/2019 Document Reviewed: 07/27/2018 Elsevier Patient Education  2020 [employer].

## 2020-11-21 NOTE — Progress Notes (Signed)
ROB [phone]

## 2020-11-21 NOTE — Progress Notes (Signed)
Routine Prenatal Care Visit  Subjective  Morgan Grant is a 34 y.o. G1P0 at 43w4dbeing seen today for ongoing prenatal care.  She is currently monitored for the following issues for this high-risk pregnancy and has Irregular menses; Environmental and seasonal allergies; BMI 50.0-59.9, [place]; Supervision of high risk pregnancy, antepartum; Obesity affecting pregnancy; and BRCA gene mutation positive on their problem list.  ----------------------------------------------------------------------------------- Patient reports no complaints.   Contractions: Not present. Vag. Bleeding: None.  Movement: Present. Denies leaking of fluid.  ----------------------------------------------------------------------------------- The following portions of the patient's history were reviewed and updated as appropriate: allergies, current medications, past family history, past medical history, past social history, past surgical history and problem list. Problem list updated.  Do you snore loudly? ( louder than talking or loud enough to be heard through closed doors?) YES Do you often feel tired, fatigued, or sleepy during daytime? YES Has anyone observed you stop breathing during sleep? NO Do you have or are you being treated for high blood pressure? NO BMI> 35 YES Age> 50 NO Neck circumference > 40 cm YES, 430CM Female gender? NO  ** if yes to > 3 questions high risk of obstructive sleep apnea ** if yes to <3 questions+ low risk for obstructive sleep apnea   Objective  Blood pressure 110/70, height 5' 9" (1.753 m), weight (!) 309 lb (140.2 kg), last menstrual period 06/11/2020. Pregravid weight 304 lb (137.9 kg) Total Weight Gain 5 lb (2.268 kg) Urinalysis:      Fetal Status: Fetal Heart Rate (bpm): 134   Movement: Present  Presentation: Complete Breech  General:  Alert, oriented and cooperative. Patient is in no acute distress.  Skin: Skin is warm and dry. No rash noted.    Cardiovascular: Normal heart rate noted  Respiratory: Normal respiratory effort, no problems with respiration noted  Abdomen: Soft, gravid, appropriate for gestational age. Pain/Pressure: Present     Pelvic:  Cervical exam deferred        Extremities: Normal range of motion.     Mental Status: Normal mood and affect. Normal behavior. Normal judgment and thought content.     Assessment   34y.o. G1P0 at 266w4dy  03/09/2021, by Ultrasound presenting for routine prenatal visit  Plan   pregnancy Problems (from 08/17/20 to present)    Problem Noted Resolved   Supervision of high risk pregnancy, antepartum 08/17/2020 by Morgan Grant No   Overview Addendum 11/21/2020 10:46 AM by Morgan Grant     Nursing Staff Provider  Office Location  [facility] Dating    Language  English Anatomy USKoreacomplete  Flu Vaccine  08/28/2020 Genetic Screen  NIPS: normal xx  TDaP vaccine    Hgb A1C or  GTT Early :83 Third trimester :   Rhogam   not needed   LAB RESULTS   Feeding Plan  Blood Type O/Positive/-- (09/14 1029)   Contraception  Antibody Negative (09/14 1029)  Circumcision  Rubella <0.90 (09/14 1029)  NI  Pediatrician   RPR Non Reactive (09/14 1029)   Support Person  Husband HBsAg Negative (09/14 1029)   Prenatal Classes  HIV Non Reactive (09/14 1029)    Varicella Immune  BTL Consent  GBS  (For PCN allergy, check sensitivities)        VBAC Consent  Pap  2019    Hgb Electro    Covid Vaccinated CF      SMA  High risk pregnancy Diagnoses Obesity in pregnancy       Previous Version   Obesity affecting pregnancy 08/17/2020 by Morgan Grant No       Referral for sleep study made Referral to anesthesia Standing order for growth [place] starting at 28 weeks 28 week labs next visit Start NST weekly at [redacted] weeks  Gestational age appropriate obstetric precautions including but not limited to vaginal bleeding, contractions, leaking of fluid and fetal movement were  reviewed in detail with the patient.    Return in about 4 weeks (around 12/19/2020) for [facility] visit in person, growth [place].  Morgan Fellers [facility], [facility] 11/21/2020, 11:04 AM

## 2020-12-05 ENCOUNTER — Other Ambulatory Visit: Payer: Self-pay

## 2020-12-05 ENCOUNTER — Encounter
Admission: RE | Admit: 2020-12-05 | Discharge: 2020-12-05 | Disposition: A | Discharge status: Home / Self Care | Payer: BC Managed Care – PPO | Source: Ambulatory Visit | Attending: Anesthesiology | Admitting: Anesthesiology

## 2020-12-05 NOTE — Consult Note (Signed)
[facility] Anesthesia Consultation  Morgan Grant EPP:[id number] DOB: 1986-11-07 DOA: 12/05/2020 PCP: [name], MD   Requesting physician: Dr. Glennon Mac Date of consultation: 12/05/20 Reason for consultation: Obesity during pregnancy  CHIEF COMPLAINT:  Obesity during pregnancy  HISTORY OF PRESENT ILLNESS: Morgan Grant  is a 34 y.o. female with a known history of obesity during pregnancy.  She presents for anesthesia evaluation in anticipation of labor and delivery.  PAST MEDICAL HISTORY:   Past Medical History:  Diagnosis Date  . 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 BRCA2 gene mutation positive 08/2020   [facility] genetic testing  . Family history of pancreatic cancer   . No known health problems     PAST SURGICAL HISTORY:  Past Surgical History:  Procedure Laterality Date  . NO PAST SURGERIES      SOCIAL HISTORY:  Social History   Tobacco Use  . Smoking status: Never Smoker  . Smokeless tobacco: Never Used  Substance Use Topics  . Alcohol use: Yes    Alcohol/week: 0.0 standard drinks    Comment: not during pregnancy    FAMILY HISTORY:  Family History  Problem Relation Age of Onset  . Hypertension Mother   . Pancreatic cancer Father 5  . Diabetes Maternal Grandmother   . Pancreatic cancer Maternal Grandmother 89    DRUG ALLERGIES: No Known Allergies  REVIEW OF SYSTEMS:   RESPIRATORY: No cough, shortness of breath, wheezing.  CARDIOVASCULAR: No chest pain, orthopnea, edema.  HEMATOLOGY: No anemia, easy bruising or bleeding SKIN: No rash or lesion. NEUROLOGIC: No tingling, numbness, weakness.  PSYCHIATRY: No anxiety or depression.   MEDICATIONS AT HOME:  Prior to Admission medications   Not on File      PHYSICAL EXAMINATION:   VITAL SIGNS: Height _0  (1.753 m), weight (!) 142.2 kg, last menstrual period 06/11/2020.  GENERAL:  34 y.o.-year-old patient no acute distress.   HEENT: Head atraumatic, normocephalic. Oropharynx and nasopharynx clear. MP 2, TM distance >3 cm, normal mouth opening. LUNGS: No use of accessory muscles of respiration.   EXTREMITIES: No pedal edema, cyanosis, or clubbing.  NEUROLOGIC: normal gait PSYCHIATRIC: The patient is alert and oriented x 3.  SKIN: No obvious rash, lesion, or ulcer.    IMPRESSION AND PLAN:   Morgan Grant  is a 34 y.o. female presenting with obesity during pregnancy. BMI is currently 46 at [redacted] weeks gestation.   We discussed analgesic options during labor including epidural analgesia. Discussed that in obesity there can be increased difficulty with epidural placement or even failure of successful epidural. We also discussed that even after successful epidural placement there is increased risk of catheter migration out of the epidural space that would require catheter replacement. Discussed use of epidural vs spinal vs GA if cesarean delivery is required. Discussed increased risk of difficult intubation during pregnancy should an emergency cesarean delivery be required.   At this time her airway exam is reassuring. We discussed repeat evaluation at 35-36 weeks by anesthesia to determine whether there is a high risk of complications of anesthesia for which we would recommend transfer of OB care to a facility with a higher maternal level of care designation.  The patient was in agreement with this plan.  Tera Mater, MD

## 2020-12-17 NOTE — Progress Notes (Unsigned)
Pt was not seen today due to canceling appointment. Office staff will reach out to patient to reschedule.

## 2020-12-18 ENCOUNTER — Ambulatory Visit: Payer: Self-pay

## 2020-12-19 ENCOUNTER — Other Ambulatory Visit: Payer: Self-pay

## 2020-12-19 ENCOUNTER — Encounter: Payer: Self-pay | Admitting: Obstetrics & Gynecology

## 2020-12-19 ENCOUNTER — Encounter: Payer: BC Managed Care – PPO | Admitting: Obstetrics & Gynecology

## 2020-12-19 ENCOUNTER — Other Ambulatory Visit: Payer: BC Managed Care – PPO

## 2020-12-19 ENCOUNTER — Ambulatory Visit (INDEPENDENT_AMBULATORY_CARE_PROVIDER_SITE_OTHER): Payer: 59

## 2020-12-19 ENCOUNTER — Ambulatory Visit (INDEPENDENT_AMBULATORY_CARE_PROVIDER_SITE_OTHER): Payer: 59 | Admitting: Obstetrics & Gynecology

## 2020-12-19 VITALS — BP 110/78 | Wt 315.0 lb

## 2020-12-19 DIAGNOSIS — Z3A28 28 weeks gestation of pregnancy: Secondary | ICD-10-CM | POA: Diagnosis not present

## 2020-12-19 DIAGNOSIS — O0992 Supervision of high risk pregnancy, unspecified, second trimester: Secondary | ICD-10-CM

## 2020-12-19 DIAGNOSIS — O0990 Supervision of high risk pregnancy, unspecified, unspecified trimester: Secondary | ICD-10-CM

## 2020-12-19 DIAGNOSIS — Z3A24 24 weeks gestation of pregnancy: Secondary | ICD-10-CM

## 2020-12-19 DIAGNOSIS — O99213 Obesity complicating pregnancy, third trimester: Secondary | ICD-10-CM

## 2020-12-19 DIAGNOSIS — O99212 Obesity complicating pregnancy, second trimester: Secondary | ICD-10-CM

## 2020-12-19 DIAGNOSIS — O0993 Supervision of high risk pregnancy, unspecified, third trimester: Secondary | ICD-10-CM

## 2020-12-19 LAB — POCT URINALYSIS DIPSTICK OB
Bilirubin, UA: NEGATIVE
Blood, UA: NEGATIVE
Glucose, UA: NEGATIVE
Ketones, UA: NEGATIVE
Leukocytes, UA: NEGATIVE
Nitrite, UA: NEGATIVE
POC,PROTEIN,UA: NEGATIVE
Spec Grav, UA: 1.01 (ref 1.010–1.025)
Urobilinogen, UA: 0.2 E.U./dL
pH, UA: 6 (ref 5.0–8.0)

## 2020-12-19 NOTE — Patient Instructions (Signed)
Thank you for choosing [facility]. As part of our ongoing efforts to improve patient experience, we would appreciate your feedback. Please fill out the short survey that you will receive by mail or MyChart. Your opinion is important to [place]! -Dr Tiburcio Pea  Recommendations to boost your immunity to prevent illness such as viral flu and colds, including covid19, are as follows: Vitamin K2 and Vitamin D3. Take Vitamin K2 at 200-300 mcg daily (usually 2-3 pills daily of the over the counter formulation). Take Vitamin D3 at 3000-4000 U daily (usually 3-4 pills daily of the over the counter formulation). Studies show that these two at high normal levels in your system are very effective in keeping your immunity so strong and protective that you will be unlikely to contract viral illness such as those listed above.  Dr Tiburcio Pea   Third Trimester of Pregnancy The third trimester is from week 28 through week 40 (months 7 through 9). The third trimester is a time when the unborn baby (fetus) is growing rapidly. At the end of the ninth month, the fetus is about 20 inches in length and weighs 6-10 pounds. Body changes during your third trimester Your body will continue to go through many changes during pregnancy. The changes vary from woman to woman. During the third trimester:  Your weight will continue to increase. You can expect to gain 25-35 pounds (11-16 kg) by the end of the pregnancy.  You may begin to get stretch marks on your hips, abdomen, and breasts.  You may urinate more often because the fetus is moving lower into your pelvis and pressing on your bladder.  You may develop or continue to have heartburn. This is caused by increased hormones that slow down muscles in the digestive tract.  You may develop or continue to have constipation because increased hormones slow digestion and cause the muscles that push waste through your intestines to relax.  You may develop hemorrhoids. These are  swollen veins (varicose veins) in the rectum that can itch or be painful.  You may develop swollen, bulging veins (varicose veins) in your legs.  You may have increased body aches in the pelvis, back, or thighs. This is due to weight gain and increased hormones that are relaxing your joints.  You may have changes in your hair. These can include thickening of your hair, rapid growth, and changes in texture. Some women also have hair loss during or after pregnancy, or hair that feels dry or thin. Your hair will most likely return to normal after your baby is born.  Your breasts will continue to grow and they will continue to become tender. A yellow fluid (colostrum) may leak from your breasts. This is the first milk you are producing for your baby.  Your belly button may stick out.  You may notice more swelling in your hands, face, or ankles.  You may have increased tingling or numbness in your hands, arms, and legs. The skin on your belly may also feel numb.  You may feel short of breath because of your expanding uterus.  You may have more problems sleeping. This can be caused by the size of your belly, increased need to urinate, and an increase in your body's metabolism.  You may notice the fetus "dropping," or moving lower in your abdomen (lightening).  You may have increased vaginal discharge.  You may notice your joints feel loose and you may have pain around your pelvic bone. What to expect at prenatal visits You  will have prenatal exams every 2 weeks until week 36. Then you will have weekly prenatal exams. During a routine prenatal visit:  You will be weighed to make sure you and the baby are growing normally.  Your blood pressure will be taken.  Your abdomen will be measured to track your baby's growth.  The fetal heartbeat will be listened to.  Any test results from the previous visit will be discussed.  You may have a cervical check near your due date to see if your  cervix has softened or thinned (effaced).  You will be tested for Group B streptococcus. This happens between 35 and 37 weeks. Your health care provider may ask you:  What your birth plan is.  How you are feeling.  If you are feeling the baby move.  If you have had any abnormal symptoms, such as leaking fluid, bleeding, severe headaches, or abdominal cramping.  If you are using any tobacco products, including cigarettes, chewing tobacco, and electronic cigarettes.  If you have any questions. Other tests or screenings that may be performed during your third trimester include:  Blood tests that check for low iron levels (anemia).  Fetal testing to check the health, activity level, and growth of the fetus. Testing is done if you have certain medical conditions or if there are problems during the pregnancy.  Nonstress test (NST). This test checks the health of your baby to make sure there are no signs of problems, such as the baby not getting enough oxygen. During this test, a belt is placed around your belly. The baby is made to move, and its heart rate is monitored during movement. What is false labor? False labor is a condition in which you feel small, irregular tightenings of the muscles in the womb (contractions) that usually go away with rest, changing position, or drinking water. These are called Braxton Hicks contractions. Contractions may last for hours, days, or even weeks before true labor sets in. If contractions come at regular intervals, become more frequent, increase in intensity, or become painful, you should see your health care provider. What are the signs of labor?  Abdominal cramps.  Regular contractions that start at 10 minutes apart and become stronger and more frequent with time.  Contractions that start on the top of the uterus and spread down to the lower abdomen and back.  Increased pelvic pressure and dull back pain.  A watery or bloody mucus discharge that  comes from the vagina.  Leaking of amniotic fluid. This is also known as your "water breaking." It could be a slow trickle or a gush. Let your health care provider know if it has a color or strange odor. If you have any of these signs, call your health care provider right away, even if it is before your due date. Follow these instructions at home: Medicines  Follow your health care provider's instructions regarding medicine use. Specific medicines may be either safe or unsafe to take during pregnancy.  Take a prenatal vitamin that contains at least 600 micrograms (mcg) of folic acid.  If you develop constipation, try taking a stool softener if your health care provider approves. Eating and drinking   Eat a balanced diet that includes fresh fruits and vegetables, whole grains, good sources of protein such as meat, eggs, or tofu, and low-fat dairy. Your health care provider will help you determine the amount of weight gain that is right for you.  Avoid raw meat and uncooked cheese. These carry  germs that can cause birth defects in the baby.  If you have low calcium intake from food, talk to your health care provider about whether you should take a daily calcium supplement.  Eat four or five small meals rather than three large meals a day.  Limit foods that are high in fat and processed sugars, such as fried and sweet foods.  To prevent constipation: ? Drink enough fluid to keep your urine clear or pale yellow. ? Eat foods that are high in fiber, such as fresh fruits and vegetables, whole grains, and beans. Activity  Exercise only as directed by your health care provider. Most women can continue their usual exercise routine during pregnancy. Try to exercise for 30 minutes at least 5 days a week. Stop exercising if you experience uterine contractions.  Avoid heavy lifting.  Do not exercise in extreme heat or humidity, or at high altitudes.  Wear low-heel, comfortable  shoes.  Practice good posture.  You may continue to have sex unless your health care provider tells you otherwise. Relieving pain and discomfort  Take frequent breaks and rest with your legs elevated if you have leg cramps or low back pain.  Take warm sitz baths to soothe any pain or discomfort caused by hemorrhoids. Use hemorrhoid cream if your health care provider approves.  Wear a good support bra to prevent discomfort from breast tenderness.  If you develop varicose veins: ? Wear support pantyhose or compression stockings as told by your healthcare provider. ? Elevate your feet for 15 minutes, 3-4 times a day. Prenatal care  Write down your questions. Take them to your prenatal visits.  Keep all your prenatal visits as told by your health care provider. This is important. Safety  Wear your seat belt at all times when driving.  Make a list of emergency phone numbers, including numbers for family, friends, the hospital, and police and fire departments. General instructions  Avoid cat litter boxes and soil used by cats. These carry germs that can cause birth defects in the baby. If you have a cat, ask someone to clean the litter box for you.  Do not travel far distances unless it is absolutely necessary and only with the approval of your health care provider.  Do not use hot tubs, steam rooms, or saunas.  Do not drink alcohol.  Do not use any products that contain nicotine or tobacco, such as cigarettes and e-cigarettes. If you need help quitting, ask your health care provider.  Do not use any medicinal herbs or unprescribed drugs. These chemicals affect the formation and growth of the baby.  Do not douche or use tampons or scented sanitary pads.  Do not cross your legs for long periods of time.  To prepare for the arrival of your baby: ? Take prenatal classes to understand, practice, and ask questions about labor and delivery. ? Make a trial run to the  hospital. ? Visit the hospital and tour the maternity area. ? Arrange for maternity or paternity leave through employers. ? Arrange for family and friends to take care of pets while you are in the hospital. ? Purchase a rear-facing car seat and make sure you know how to install it in your car. ? Pack your hospital bag. ? Prepare the baby's nursery. Make sure to remove all pillows and stuffed animals from the baby's crib to prevent suffocation.  Visit your dentist if you have not gone during your pregnancy. Use a soft toothbrush to brush your teeth   and be gentle when you floss. Contact a health care provider if:  You are unsure if you are in labor or if your water has broken.  You become dizzy.  You have mild pelvic cramps, pelvic pressure, or nagging pain in your abdominal area.  You have lower back pain.  You have persistent nausea, vomiting, or diarrhea.  You have an unusual or bad smelling vaginal discharge.  You have pain when you urinate. Get help right away if:  Your water breaks before 37 weeks.  You have regular contractions less than 5 minutes apart before 37 weeks.  You have a fever.  You are leaking fluid from your vagina.  You have spotting or bleeding from your vagina.  You have severe abdominal pain or cramping.  You have rapid weight loss or weight gain.  You have shortness of breath with chest pain.  You notice sudden or extreme swelling of your face, hands, ankles, feet, or legs.  Your baby makes fewer than 10 movements in 2 hours.  You have severe headaches that do not go away when you take medicine.  You have vision changes. Summary  The third trimester is from week 28 through week 40, months 7 through 9. The third trimester is a time when the unborn baby (fetus) is growing rapidly.  During the third trimester, your discomfort may increase as you and your baby continue to gain weight. You may have abdominal, leg, and back pain, sleeping  problems, and an increased need to urinate.  During the third trimester your breasts will keep growing and they will continue to become tender. A yellow fluid (colostrum) may leak from your breasts. This is the first milk you are producing for your baby.  False labor is a condition in which you feel small, irregular tightenings of the muscles in the womb (contractions) that eventually go away. These are called Braxton Hicks contractions. Contractions may last for hours, days, or even weeks before true labor sets in.  Signs of labor can include: abdominal cramps; regular contractions that start at 10 minutes apart and become stronger and more frequent with time; watery or bloody mucus discharge that comes from the vagina; increased pelvic pressure and dull back pain; and leaking of amniotic fluid. This information is not intended to replace advice given to you by your health care provider. Make sure you discuss any questions you have with your health care provider. Document Revised: 03/24/2019 Document Reviewed: 01/06/2017 Elsevier Patient Education  2020 [employer].

## 2020-12-19 NOTE — Progress Notes (Signed)
  Subjective  Fetal Movement? yes Contractions? no Leaking Fluid? no Vaginal Bleeding? no  Objective  BP 110/78   Wt (!) 315 lb (142.9 kg)   LMP 06/11/2020 (Exact Date)   BMI 46.52 kg/m  General: NAD Pumonary: no increased work of breathing Abdomen: gravid, non-tender Extremities: no edema Psychiatric: mood appropriate, affect full  Assessment  35 y.o. G1P0 at [redacted]w[redacted]d by  03/09/2021, by Ultrasound presenting for routine prenatal visit  Plan   Problem List Items Addressed This Visit      Other   Obesity affecting pregnancy    Other Visit Diagnoses    [redacted] weeks gestation of pregnancy    -  Primary   Relevant Orders   POC Urinalysis Dipstick OB   Supervision of high risk pregnancy in third trimester          pregnancy Problems (from 08/17/20 to present)    Problem Noted Resolved   Supervision of high risk pregnancy, antepartum 08/17/2020 by Will Bonnet, MD No   Overview Addendum 11/21/2020 10:46 AM by [name], MD     Nursing Staff Provider  Office Location  [facility] Dating    Language  English Anatomy US  complete  Flu Vaccine  08/28/2020 Genetic Screen  NIPS: normal xx  TDaP vaccine    Hgb A1C or  GTT Early :83 Third trimester :   Rhogam   not needed   LAB RESULTS   Feeding Plan  Blood Type O/Positive/-- (09/14 1029)   Contraception  Antibody Negative (09/14 1029)  Circumcision  Rubella <0.90 (09/14 1029)  NI  Pediatrician   RPR Non Reactive (09/14 1029)   Support Person  Husband HBsAg Negative (09/14 1029)   Prenatal Classes  HIV Non Reactive (09/14 1029)    Varicella Immune  BTL Consent  GBS  (For PCN allergy, check sensitivities)        VBAC Consent  Pap  2019    Hgb Electro    Covid Vaccinated CF      SMA         High risk pregnancy Diagnoses Obesity in pregnancy       Previous Version   Obesity affecting pregnancy 08/17/2020 by Will Bonnet, MD No   Overview Signed 11/21/2020 11:11 AM by [name], MD    Weight  gain recommendation BMI> 30:  11-20 lbs- discussed  [ x] Screen of OSA- positive, referral placed  [x ] Early diabetes screening for BMI>30- WNL  [x ] Consultation with Anesthesia done  Antenatal Testing BMI 40 or > Weekly at 34 weeks         Anes consult UTD PNV, [facility] [place] today discussed, normal growth Glucola today  Also discussed genetic counseling for BRCA after pregnancy  Barnett Applebaum, MD, [facility], [facility] 12/19/2020  9:32 AM

## 2020-12-20 LAB — 28 WEEK RH+PANEL
Basophils Absolute: 0 10*3/uL (ref 0.0–0.2)
Basos: 0 %
EOS (ABSOLUTE): 0.1 10*3/uL (ref 0.0–0.4)
Eos: 1 %
Gestational Diabetes Screen: 81 mg/dL (ref 65–139)
HIV Screen 4th Generation wRfx: NONREACTIVE
Hematocrit: 38.6 % (ref 34.0–46.6)
Hemoglobin: 13.2 g/dL (ref 11.1–15.9)
Immature Grans (Abs): 0.1 10*3/uL (ref 0.0–0.1)
Immature Granulocytes: 1 %
Lymphocytes Absolute: 1.7 10*3/uL (ref 0.7–3.1)
Lymphs: 21 %
MCH: 33.6 pg — ABNORMAL HIGH (ref 26.6–33.0)
MCHC: 34.2 g/dL (ref 31.5–35.7)
MCV: 98 fL — ABNORMAL HIGH (ref 79–97)
Monocytes Absolute: 0.6 10*3/uL (ref 0.1–0.9)
Monocytes: 7 %
Neutrophils Absolute: 5.8 10*3/uL (ref 1.4–7.0)
Neutrophils: 70 %
Platelets: 307 10*3/uL (ref 150–450)
RBC: 3.93 x10E6/uL (ref 3.77–5.28)
RDW: 12.1 % (ref 11.7–15.4)
RPR Ser Ql: NONREACTIVE
WBC: 8.4 10*3/uL (ref 3.4–10.8)

## 2021-01-02 ENCOUNTER — Encounter: Payer: 59 | Admitting: Obstetrics & Gynecology

## 2021-01-04 ENCOUNTER — Telehealth: Payer: Self-pay

## 2021-01-04 NOTE — Telephone Encounter (Signed)
Patient calling about a pre authorization for breast pump to be covered by insurance. [id number]#[phone]

## 2021-01-04 NOTE — Telephone Encounter (Signed)
Spoke w/patient. Advised we have pamphlets/brochures that she can view available pumps and choose style. Provider will provide the rx. We can fax from our office. She will discuss at next visit.

## 2021-01-10 ENCOUNTER — Encounter: Payer: Self-pay | Admitting: Obstetrics & Gynecology

## 2021-01-10 ENCOUNTER — Other Ambulatory Visit: Payer: Self-pay

## 2021-01-10 ENCOUNTER — Ambulatory Visit (INDEPENDENT_AMBULATORY_CARE_PROVIDER_SITE_OTHER): Payer: 59 | Admitting: Obstetrics & Gynecology

## 2021-01-10 VITALS — BP 120/80 | Wt 318.0 lb

## 2021-01-10 DIAGNOSIS — Z23 Encounter for immunization: Secondary | ICD-10-CM | POA: Diagnosis not present

## 2021-01-10 DIAGNOSIS — Z3A31 31 weeks gestation of pregnancy: Secondary | ICD-10-CM

## 2021-01-10 DIAGNOSIS — O99213 Obesity complicating pregnancy, third trimester: Secondary | ICD-10-CM

## 2021-01-10 DIAGNOSIS — O0993 Supervision of high risk pregnancy, unspecified, third trimester: Secondary | ICD-10-CM

## 2021-01-10 LAB — POCT URINALYSIS DIPSTICK OB
Glucose, UA: NEGATIVE
POC,PROTEIN,UA: NEGATIVE

## 2021-01-10 NOTE — Progress Notes (Signed)
  Subjective  Fetal Movement? yes Contractions? no Leaking Fluid? no Vaginal Bleeding? no  Objective  BP 120/80   Wt (!) 318 lb (144.2 kg)   LMP 06/11/2020 (Exact Date)   BMI 46.96 kg/m  General: NAD Pumonary: no increased work of breathing Abdomen: gravid, non-tender Extremities: no edema Psychiatric: mood appropriate, affect full  Assessment  35 y.o. G1P0 at [redacted]w[redacted]d by  03/09/2021, by Ultrasound presenting for routine prenatal visit  Plan   Problem List Items Addressed This Visit      Other   Obesity affecting pregnancy   Relevant Orders   US OB Follow Up    Other Visit Diagnoses    [redacted] weeks gestation of pregnancy    -  Primary   Relevant Orders   POC Urinalysis Dipstick OB (Completed)   Need for Tdap vaccination       Supervision of high risk pregnancy in third trimester          pregnancy Problems (from 08/17/20 to present)    Problem Noted Resolved   Supervision of high risk pregnancy, antepartum     Overview Addendum 12/19/2020  9:35 AM by Nadara Mustard, MD     Nursing Staff Provider  Office Location  [facility] Dating    Language  English Anatomy US  complete  Flu Vaccine  08/28/2020 Genetic Screen  NIPS: normal xx  TDaP vaccine    Hgb A1C or  GTT Early :83 Third trimester :   Rhogam   not needed   LAB RESULTS   Feeding Plan Breast Blood Type O/Positive/-- (09/14 1029)   Contraception  Antibody Negative (09/14 1029)  Circumcision n/a Rubella <0.90 (09/14 1029)  NI  Pediatrician   RPR Non Reactive (09/14 1029)   Support Person  Husband HBsAg Negative (09/14 1029)   Prenatal Classes  HIV Non Reactive (09/14 1029)    Varicella Immune  BTL Consent n/a GBS  (For PCN allergy, check sensitivities)        VBAC Consent n/a Pap  2019    Hgb Electro    Covid Vaccinated CF      SMA         High risk pregnancy Diagnoses Obesity in pregnancy       Obesity affecting pregnancy     Overview Signed 11/21/2020 11:11 AM by Natale Milch, MD    Weight  gain recommendation BMI> 30:  11-20 lbs- discussed  [ x] Screen of OSA- positive, referral placed  [x ] Early diabetes screening for BMI>30- WNL  [x ] Consultation with Anesthesia done, needs recheck 35-36 weeks per Anes notes  Antenatal Testing BMI 40 or > Weekly at 34 weeks          PNV, FMC, PTL precautions  Breast feeding plans, wants to order pump  [place] for growth nv; cont baby ASA daily; anex re-consult 35-36 weeks  APT weekly 36 weeks (obesity)  Annamarie Major, MD, [facility], [facility] 01/10/2021  3:32 PM

## 2021-01-10 NOTE — Patient Instructions (Signed)

## 2021-01-24 ENCOUNTER — Ambulatory Visit: Payer: 59

## 2021-01-24 ENCOUNTER — Encounter: Payer: 59 | Admitting: Obstetrics & Gynecology

## 2021-01-24 DIAGNOSIS — O99213 Obesity complicating pregnancy, third trimester: Secondary | ICD-10-CM

## 2021-01-31 ENCOUNTER — Ambulatory Visit: Payer: 59

## 2021-01-31 ENCOUNTER — Ambulatory Visit (INDEPENDENT_AMBULATORY_CARE_PROVIDER_SITE_OTHER): Payer: 59 | Admitting: Advanced Practice Midwife

## 2021-01-31 ENCOUNTER — Encounter: Payer: Self-pay | Admitting: Advanced Practice Midwife

## 2021-01-31 ENCOUNTER — Other Ambulatory Visit: Payer: Self-pay

## 2021-01-31 VITALS — BP 124/78 | Wt 320.0 lb

## 2021-01-31 DIAGNOSIS — O99213 Obesity complicating pregnancy, third trimester: Secondary | ICD-10-CM

## 2021-01-31 DIAGNOSIS — O0993 Supervision of high risk pregnancy, unspecified, third trimester: Secondary | ICD-10-CM

## 2021-01-31 DIAGNOSIS — Z3A34 34 weeks gestation of pregnancy: Secondary | ICD-10-CM

## 2021-01-31 NOTE — Progress Notes (Signed)
Routine Prenatal Care Visit  Subjective  Morgan Grant is a 35 y.o. G1P0 at 55w5dbeing seen today for ongoing prenatal care.  She is currently monitored for the following issues for this high-risk pregnancy and has Irregular menses; Environmental and seasonal allergies; BMI 50.0-59.9, [place]; Supervision of high risk pregnancy, antepartum; Obesity affecting pregnancy; and BRCA gene mutation positive on their problem list.  ----------------------------------------------------------------------------------- Patient reports some pelvic and groin pressure especially when changing positions.   Contractions: Not present. Vag. Bleeding: None.  Movement: Present. Leaking Fluid denies.  ----------------------------------------------------------------------------------- The following portions of the patient's history were reviewed and updated as appropriate: allergies, current medications, past family history, past medical history, past social history, past surgical history and problem list. Problem list updated.  Objective  Blood pressure 124/78, weight (!) 320 lb (145.2 kg), last menstrual period 06/11/2020. Pregravid weight 304 lb (137.9 kg) Total Weight Gain 16 lb (7.258 kg) Urinalysis: Urine Protein    Urine Glucose    Fetal Status: Fetal Heart Rate (bpm): 136   Movement: Present     General:  Alert, oriented and cooperative. Patient is in no acute distress.  Skin: Skin is warm and dry. No rash noted.   Cardiovascular: Normal heart rate noted  Respiratory: Normal respiratory effort, no problems with respiration noted  Abdomen: Soft, gravid, appropriate for gestational age. Pain/Pressure: Absent     Pelvic:  Cervical exam deferred        Extremities: Normal range of motion.  Edema: None  Mental Status: Normal mood and affect. Normal behavior. Normal judgment and thought content.   Assessment   35y.o. G1P0 at 316w5dy  03/09/2021, by Ultrasound presenting for routine prenatal  visit  Plan   pregnancy Problems (from 08/17/20 to present)    Problem Noted Resolved   Supervision of high risk pregnancy, antepartum 08/17/2020 by JaWill BonnetMD No   Overview Addendum 12/19/2020  9:35 AM by HaGae DryMD     Nursing Staff Provider  Office Location  [facility] Dating    Language  English Anatomy USKoreacomplete  Flu Vaccine  08/28/2020 Genetic Screen  NIPS: normal xx  TDaP vaccine    Hgb A1C or  GTT Early :83 Third trimester :   Rhogam   not needed   LAB RESULTS   Feeding Plan Breast Blood Type O/Positive/-- (09/14 1029)   Contraception  Antibody Negative (09/14 1029)  Circumcision n/a Rubella <0.90 (09/14 1029)  NI  Pediatrician   RPR Non Reactive (09/14 1029)   Support Person  Husband HBsAg Negative (09/14 1029)   Prenatal Classes  HIV Non Reactive (09/14 1029)    Varicella Immune  BTL Consent n/a GBS  (For PCN allergy, check sensitivities)        VBAC Consent n/a Pap  2019    Hgb Electro    Covid Vaccinated CF      SMA         High risk pregnancy Diagnoses Obesity in pregnancy       Previous Version   Obesity affecting pregnancy 08/17/2020 by JaWill BonnetMD No   Overview Signed 11/21/2020 11:11 AM by ScHomero FellersMD    Weight gain recommendation BMI> 30:  11-20 lbs- discussed  [ x] Screen of OSA- positive, referral placed  [x ] Early diabetes screening for BMI>30- WNL  [ ]  Consultation with Anesthesia, referral placed  Antenatal Testing BMI 40 or > Weekly at 34 weeks  Preterm labor symptoms and general obstetric precautions including but not limited to vaginal bleeding, contractions, leaking of fluid and fetal movement were reviewed in detail with the patient. Please refer to After Visit Summary for other counseling recommendations.   Return in about 1 week (around 02/07/2021) for needs ultrasound from today rescheduled and rob and nst.  Rod Can, CNM 01/31/2021 11:56 AM

## 2021-01-31 NOTE — Patient Instructions (Signed)

## 2021-01-31 NOTE — Progress Notes (Signed)
No vb. No lof.  

## 2021-02-04 ENCOUNTER — Ambulatory Visit: Payer: Self-pay

## 2021-02-04 ENCOUNTER — Other Ambulatory Visit: Payer: Self-pay | Admitting: Obstetrics and Gynecology

## 2021-02-04 DIAGNOSIS — Z6841 Body Mass Index (BMI) 40.0 and over, adult: Secondary | ICD-10-CM

## 2021-02-04 DIAGNOSIS — O0990 Supervision of high risk pregnancy, unspecified, unspecified trimester: Secondary | ICD-10-CM

## 2021-02-04 DIAGNOSIS — O99213 Obesity complicating pregnancy, third trimester: Secondary | ICD-10-CM

## 2021-02-04 DIAGNOSIS — Z3A35 35 weeks gestation of pregnancy: Secondary | ICD-10-CM

## 2021-02-07 ENCOUNTER — Other Ambulatory Visit: Payer: Self-pay

## 2021-02-07 ENCOUNTER — Ambulatory Visit (INDEPENDENT_AMBULATORY_CARE_PROVIDER_SITE_OTHER): Payer: 59 | Admitting: Obstetrics and Gynecology

## 2021-02-07 ENCOUNTER — Ambulatory Visit: Payer: 59

## 2021-02-07 VITALS — BP 128/70 | Ht 69.0 in | Wt 321.6 lb

## 2021-02-07 DIAGNOSIS — O0990 Supervision of high risk pregnancy, unspecified, unspecified trimester: Secondary | ICD-10-CM

## 2021-02-07 DIAGNOSIS — O99213 Obesity complicating pregnancy, third trimester: Secondary | ICD-10-CM

## 2021-02-07 LAB — FETAL NONSTRESS TEST

## 2021-02-07 NOTE — Patient Instructions (Signed)
How bad does labor hurt? Labor and birth are different for each person. The amount of pain and where you feel pain the most changes during labor. Even if you have had a baby before, labor pain can be different with each baby. Nobody can know ahead of time how difficult or painful their labor will be.  Why does labor hurt? During labor, your uterus (womb) pushes your baby down and stretches your cervix (the opening of your uterus). When your uterus has a contraction (muscles get tight), you feel pain that is like a strong cramp in your abdomen or lower back. Labor pain usually feels like the cramps you feel during your period. As labor goes on and the cervix gets more stretched, the cramping pain usually gets worse. Most contractions last 30 to 60 seconds, and you will be able to rest in between each one.  How can I decide before labor starts what will be the best way for me to cope with labor pain? Take childbirth classes. The more you know, the less you fear. Being afraid and worried makes pain hurt more. Knowing what to expect will help you cope better with labor pain. Learn about the different medication and non-medication options for coping with pain that are used where you are going to give birth.  What can I do to prepare for labor? Stay physically active during pregnancy. You will have more strength to get through labor. Also, women who are in good physical shape often have shorter labors. Find a support person or doula to be with you during labor. Having a person whose only job is to be with you and support you during labor will help you cope better. Talk with your support persons and caregivers about your ideas for what will help you cope with labor pain so that you can trust them to help you make decisions that are right for you when you are in labor.  What can I do to cope during early labor? Stay up and out of bed. Walking and being on your feet can help your contractions work better but  feel less painful. Do something you enjoy. This will help you keep your mind off the pain. Drink lots of fluids so you don't get dehydrated. Eat lightly if you are hungry. Take a warm shower or bath. Water often makes your contractions easier to handle.  What can I do to cope during active labor? Women who cope well during labor rock or move and sometimes make a noise like groaning during contractions. Then they rest between contractions. Going back and forth between moving and resting in a regular rhythm helps them cope with labor as it progresses. Each person has their own rhythm that works. Here are some ideas to help you find your rhythm during labor:  Between contractions Rest by being still or by rocking gently. Focus on your breathing in and breathing out. When you pay close attention to your breathing, your body can relax. Relax your muscles, especially your shoulders and toes. Those muscles often get tight during contractions. Move or rock your hips to relax those muscles. Change positions often. Listen to music that soothes you. This may help you relax and keep your mind off the pain. During contractions Get in a tub or shower. Water can help labor feel less painful. Make noise. You can moan, hum, or repeat comforting words over and over as you go through each contraction. Some women find different breathing patterns taught in childbirth   classes helpful. Move back and forth or rock your hips in whatever way helps you cope as the contraction gets more painful and then gets less painful as it goes away.  What can my support person do to help me cope with labor pain? Help you find your rhythm and then help you keep making the same noise and movements during a contraction and doing the same relaxing things between contractions. Hold your hand quietly. Touch or rub your back, hands, or feet if being touched is something you like. Offer you juice, water, or ice chips between  contractions. Help you change positions and support your body. Make the room you are in comforting. Keep the lights low, play soft music, and have something to look at that relaxes you or makes you happy. Put a cold washcloth on your forehead or neck. Put a heating pad or warm washcloth on your lower back. Talk to you and remind you that you are strong.  What can my health care provider do to help me cope with labor pain? Answer your questions. Check your progress and give you information about how the labor is going. Provide information about different ways to help you cope with labor pain.   For More Information Childbirth Connection: Comfort in Labor https://www.nationalpartnership.org/our-work/resources/health-care/maternity/comfort-in-labor-simkin.pdf  Childbirth Connection: Labor Pain http://www.childbirthconnection.org/giving-birth/labor-pain/  [facility]: Coping with Labor Pain without Using Medicines https://my.[facility].org/health/articles/15586-labor-without-medication-coping-skills  [facility]: Choosing How You Want to Cope with Labor Pain https://www.[facility].org/healthy-lifestyle/labor-and-delivery/in-depth/labor-pain/art-20044845  [facility]: Medicines for Labor Pain https://www.[facility].org/healthy-lifestyle/labor-and-delivery/in-depth/labor-and-delivery/art-20049326  Penny Simkin: Relaxation, Rhythm, and Ritual: The 3 Rs of Childbirth https://www.pennysimkin.com/three-rs/  

## 2021-02-07 NOTE — Progress Notes (Signed)
  Routine Prenatal Care Visit  Subjective  Morgan Grant is a 35 y.o. G1P0 at [redacted]w[redacted]d being seen today for ongoing prenatal care.  She is currently monitored for the following issues for this high-risk pregnancy and has Irregular menses; Environmental and seasonal allergies; BMI 50.0-59.9, adult ([facility]); Supervision of high risk pregnancy, antepartum; Obesity affecting pregnancy; and BRCA gene mutation positive on their problem list.  ----------------------------------------------------------------------------------- Patient reports no complaints.   Contractions: Not present. Vag. Bleeding: None.  Movement: Present. Denies leaking of fluid.  ----------------------------------------------------------------------------------- The following portions of the patient's history were reviewed and updated as appropriate: allergies, current medications, past family history, past medical history, past social history, past surgical history and problem list. Problem list updated.   Objective  Blood pressure 128/70, height 5' 9" (1.753 m), weight (!) 321 lb 9.6 oz (145.9 kg), last menstrual period 06/11/2020. Pregravid weight 304 lb (137.9 kg) Total Weight Gain 17 lb 9.6 oz (7.983 kg) Urinalysis:      Fetal Status: Fetal Heart Rate (bpm): 125 (RNST) Fundal Height: 36 cm Movement: Present     General:  Alert, oriented and cooperative. Patient is in no acute distress.  Skin: Skin is warm and dry. No rash noted.   Cardiovascular: Normal heart rate noted  Respiratory: Normal respiratory effort, no problems with respiration noted  Abdomen: Soft, gravid, appropriate for gestational age. Pain/Pressure: Absent     Pelvic:  Cervical exam deferred        Extremities: Normal range of motion.  Edema: None  ental Status: Normal mood and affect. Normal behavior. Normal judgment and thought content.     Assessment   35 y.o. G1P0 at [redacted]w[redacted]d by  03/09/2021, by Ultrasound presenting for routine prenatal  visit  Plan   pregnancy Problems (from 08/17/20 to present)    Problem Noted Resolved   Supervision of high risk pregnancy, antepartum 08/17/2020 by Jackson, Stephen D, MD No   Overview Addendum 02/07/2021  4:22 PM by Veal, Katelyn, CNM     Nursing Staff Provider  Office Location  [facility] Dating   12w US  Language  English Anatomy US  complete  Flu Vaccine  08/28/2020 Genetic Screen  NIPS: normal xx  TDaP vaccine    Hgb A1C or  GTT Early :83 Third trimester : 81  Rhogam   not needed   LAB RESULTS   Feeding Plan Breast Blood Type O/Positive/-- (09/14 1029)   Contraception  POPs Antibody Negative (09/14 1029)  Circumcision n/a Rubella <0.90 (09/14 1029)  NI  Pediatrician   RPR Non Reactive (09/14 1029)   Support Person  Husband HBsAg Negative (09/14 1029)   Prenatal Classes  declined HIV Non Reactive (09/14 1029)    Varicella Immune  BTL Consent n/a GBS  (For PCN allergy, check sensitivities)        VBAC Consent n/a Pap  2019    Hgb Electro   normal adult hgb  Covid Vaccinated CF      SMA         High risk pregnancy Diagnoses Obesity in pregnancy       Previous Version   Obesity affecting pregnancy 08/17/2020 by Jackson, Stephen D, MD No   Overview Signed 11/21/2020 11:11 AM by Schuman, Christanna R, MD    Weight gain recommendation BMI> 30:  11-20 lbs- discussed  [ x] Screen of OSA- positive, referral placed  [x ] Early diabetes screening for BMI>30- WNL  [x] Consultation with Anesthesia, referral placed  Antenatal Testing BMI   40 or > Weekly at 34 weeks           -Reviewed NST with patient -Discussed birth plan, patient desires unmedicated delivery - resources for coping in labor provided in AVS -Reviewed process for GBS screening at next visit -Anesthesia consult placed  Preterm labor precautions including but not limited to vaginal bleeding, contractions, leaking of fluid and fetal movement were reviewed in detail with the patient.    Keep previously schedule  growth US and follow-up ROB.  Katelyn Veal, CNM, MSN [facility], [facility] 02/07/2021, 4:22 PM    

## 2021-02-12 ENCOUNTER — Other Ambulatory Visit: Payer: Self-pay | Admitting: Obstetrics & Gynecology

## 2021-02-12 DIAGNOSIS — O99213 Obesity complicating pregnancy, third trimester: Secondary | ICD-10-CM

## 2021-02-13 ENCOUNTER — Other Ambulatory Visit: Payer: Self-pay

## 2021-02-13 ENCOUNTER — Ambulatory Visit: Payer: 59

## 2021-02-13 ENCOUNTER — Encounter
Admission: RE | Admit: 2021-02-13 | Discharge: 2021-02-13 | Disposition: A | Discharge status: Home / Self Care | Payer: 59 | Source: Ambulatory Visit | Attending: Anesthesiology | Admitting: Anesthesiology

## 2021-02-13 NOTE — Consult Note (Signed)
[facility] Anesthesia Consultation  Sharnika Gabriela Giancola MRN:[id number] DOB: 01/28/1986 DOA: 02/13/2021 PCP: Berglund, Laura H, MD   Requesting physician: Veal, Katelyn, CNM Date of consultation: 02/13/2021 Reason for consultation: Obesity during pregnancy  CHIEF COMPLAINT:  Obesity during pregnancy  HISTORY OF PRESENT ILLNESS: Morgan Grant  is a 35 y.o. female with a known history of morbid obesity presenting for evaluation. Patient has not had anesthesia before, no known family hx of anesthetic reactions. Patient does not have any relevant medical history other than obesity; does not take any prescription medicines outside of pregnancy. No hx of blood dyscrasia.  PAST MEDICAL HISTORY:   Past Medical History:  Diagnosis Date  . 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 BRCA2 gene mutation positive 08/2020   [facility] genetic testing  . Family history of pancreatic cancer   . No known health problems     PAST SURGICAL HISTORY:  Past Surgical History:  Procedure Laterality Date  . NO PAST SURGERIES      SOCIAL HISTORY:  Social History   Tobacco Use  . Smoking status: Never Smoker  . Smokeless tobacco: Never Used  Substance Use Topics  . Alcohol use: Yes    Alcohol/week: 0.0 standard drinks    Comment: not during pregnancy    FAMILY HISTORY:  Family History  Problem Relation Age of Onset  . Hypertension Mother   . Pancreatic cancer Father 58  . Diabetes Maternal Grandmother   . Pancreatic cancer Maternal Grandmother 70    DRUG ALLERGIES: No Known Allergies  REVIEW OF SYSTEMS:   RESPIRATORY: No cough, shortness of breath, wheezing.  CARDIOVASCULAR: No chest pain, orthopnea, edema.  HEMATOLOGY: No anemia, easy bruising or bleeding SKIN: No rash or lesion. NEUROLOGIC: No tingling, numbness, weakness.  PSYCHIATRY: No anxiety or depression.   MEDICATIONS AT HOME:  Prior to Admission medications   Not on  File      PHYSICAL EXAMINATION:   VITAL SIGNS: Last menstrual period 06/11/2020.  GENERAL:  35 y.o.-year-old obese patient no acute distress.  HEENT: Head atraumatic, normocephalic. Oropharynx and nasopharynx clear. MP 2, TM distance >3 cm, normal mouth opening. LUNGS: No use of accessory muscles of respiration.   EXTREMITIES: No pedal edema, cyanosis, or clubbing.  NEUROLOGIC: normal gait PSYCHIATRIC: The patient is alert and oriented x 3.  SKIN: No obvious rash, lesion, or ulcer.    IMPRESSION AND PLAN:   [name]  is a 35 y.o. female presenting with obesity during pregnancy. BMI is currently 47.5 at [redacted]w[redacted]d gestation.   We discussed analgesic options during labor including epidural analgesia. Discussed that in obesity there can be increased difficulty with epidural placement or even failure of successful epidural. We also discussed that even after successful epidural placement there is increased risk of catheter migration out of the epidural space that would require catheter replacement. Discussed use of epidural vs spinal vs GA if cesarean delivery is required. Discussed increased risk of difficult intubation during pregnancy should an emergency cesarean delivery be required.    This patient should safely be able to receive anesthesia services at this hospital.  

## 2021-02-14 ENCOUNTER — Encounter: Payer: Self-pay | Admitting: Obstetrics & Gynecology

## 2021-02-14 ENCOUNTER — Ambulatory Visit (INDEPENDENT_AMBULATORY_CARE_PROVIDER_SITE_OTHER): Payer: 59 | Admitting: Obstetrics & Gynecology

## 2021-02-14 ENCOUNTER — Ambulatory Visit: Payer: 59 | Attending: Obstetrics

## 2021-02-14 VITALS — BP 120/80 | Wt 323.0 lb

## 2021-02-14 DIAGNOSIS — O99213 Obesity complicating pregnancy, third trimester: Secondary | ICD-10-CM | POA: Diagnosis not present

## 2021-02-14 DIAGNOSIS — Z3A36 36 weeks gestation of pregnancy: Secondary | ICD-10-CM

## 2021-02-14 DIAGNOSIS — O0990 Supervision of high risk pregnancy, unspecified, unspecified trimester: Secondary | ICD-10-CM

## 2021-02-14 DIAGNOSIS — E669 Obesity, unspecified: Secondary | ICD-10-CM | POA: Diagnosis not present

## 2021-02-14 DIAGNOSIS — Z3685 Encounter for antenatal screening for Streptococcus B: Secondary | ICD-10-CM

## 2021-02-14 NOTE — Progress Notes (Signed)
  Subjective  Fetal Movement? yes Contractions? no Leaking Fluid? no Vaginal Bleeding? no  Objective  BP 120/80   Wt (!) 323 lb (146.5 kg)   LMP 06/11/2020 (Exact Date)   BMI 49.11 kg/m  General: NAD Pumonary: no increased work of breathing Abdomen: gravid, non-tender Extremities: no edema Psychiatric: mood appropriate, affect full  A NST procedure was performed with FHR monitoring and a normal baseline established, appropriate time of 20-40 minutes of evaluation, and accels >2 seen w 15x15 characteristics.  Results show a REACTIVE NST.   Assessment  35 y.o. G1P0 at [redacted]w[redacted]d by  03/09/2021, by Ultrasound presenting for routine prenatal visit  Plan   Problem List Items Addressed This Visit      Other   Supervision of high risk pregnancy, antepartum   Obesity affecting pregnancy - Primary    Other Visit Diagnoses    [redacted] weeks gestation of pregnancy       Encounter for antenatal screening for Streptococcus B       Relevant Orders   Culture, beta strep (group b only)      Problem Noted Resolved   Supervision of high risk pregnancy, antepartum       Nursing Staff Provider  Office Location  [facility] Dating   12w [place]  Language  English Anatomy US  complete  Flu Vaccine  08/28/2020 Genetic Screen  NIPS: normal xx  TDaP vaccine    Hgb A1C or  GTT Early :83 Third trimester : 81  Rhogam   not needed   LAB RESULTS   Feeding Plan Breast Blood Type O/Positive/-- (09/14 1029)   Contraception  POPs Antibody Negative (09/14 1029)  Circumcision n/a Rubella <0.90 (09/14 1029)  NI  Pediatrician   RPR Non Reactive (09/14 1029)   Support Person  Husband HBsAg Negative (09/14 1029)   Prenatal Classes  declined HIV Non Reactive (09/14 1029)    Varicella Immune  BTL Consent n/a GBS  (For PCN allergy, check sensitivities) TODAY        VBAC Consent n/a Pap  2019    Hgb Electro   normal adult hgb  Covid Vaccinated CF      SMA             Obesity affecting pregnancy      Weight gain  recommendation BMI> 30:  11-20 lbs- discussed [ x] Screen of OSA- positive, referral placed [x ] Early diabetes screening for BMI>30- WNL [x ] Consultation with Anesthesia, approved  Antenatal Testing BMI 40 or > Weekly at 34 weeks       PNV, FMC, PTL precautions  NST weekly  GBS today  [place] reviewed from MFM for growth today  Annamarie Major, MD, [facility], [facility] 02/14/2021  3:30 PM

## 2021-02-14 NOTE — Patient Instructions (Signed)

## 2021-02-18 LAB — CULTURE, BETA STREP (GROUP B ONLY): Strep Gp B Culture: NEGATIVE

## 2021-02-21 ENCOUNTER — Other Ambulatory Visit: Payer: Self-pay

## 2021-02-21 ENCOUNTER — Encounter: Payer: Self-pay | Admitting: Obstetrics and Gynecology

## 2021-02-21 ENCOUNTER — Ambulatory Visit (INDEPENDENT_AMBULATORY_CARE_PROVIDER_SITE_OTHER): Payer: 59 | Admitting: Obstetrics and Gynecology

## 2021-02-21 VITALS — BP 120/72 | Wt 325.6 lb

## 2021-02-21 DIAGNOSIS — Z3A37 37 weeks gestation of pregnancy: Secondary | ICD-10-CM | POA: Diagnosis not present

## 2021-02-21 DIAGNOSIS — O0990 Supervision of high risk pregnancy, unspecified, unspecified trimester: Secondary | ICD-10-CM

## 2021-02-21 DIAGNOSIS — O99213 Obesity complicating pregnancy, third trimester: Secondary | ICD-10-CM

## 2021-02-21 LAB — FETAL NONSTRESS TEST

## 2021-02-21 NOTE — Progress Notes (Addendum)
Routine Prenatal Care Visit  Subjective  Morgan Grant is a 35 y.o. G1P0 at 63w5dbeing seen today for ongoing prenatal care.  She is currently monitored for the following issues for this high-risk pregnancy and has Irregular menses; Environmental and seasonal allergies; BMI 50.0-59.9, adult ([place]; Supervision of high risk pregnancy, antepartum; Obesity affecting pregnancy; and BRCA gene mutation positive on their problem list.  ----------------------------------------------------------------------------------- Patient reports no complaints.   Contractions: Not present. Vag. Bleeding: None.  Movement: Present. Denies leaking of fluid.  ----------------------------------------------------------------------------------- The following portions of the patient's history were reviewed and updated as appropriate: allergies, current medications, past family history, past medical history, past social history, past surgical history and problem list. Problem list updated.   Objective  Blood pressure 120/72, weight (!) 325 lb 9.6 oz (147.7 kg), last menstrual period 06/11/2020. Pregravid weight 304 lb (137.9 kg) Total Weight Gain 21 lb 9.6 oz (9.798 kg) Urinalysis:      Fetal Status: Fetal Heart Rate (bpm): 135 (RNST) Fundal Height: 39 cm Movement: Present      NONSTRESS TEST INTERPRETATION  INDICATIONS: maternal obesity FHR baseline: 135 RESULTS:  A NST procedure was performed with FHR monitoring and a normal baseline established, appropriate time of 20-40 minutes of evaluation, and accels >2 seen w 15x15 characteristics.  Results show a REACTIVE NST.   General:  Alert, oriented and cooperative. Patient is in no acute distress.  Skin: Skin is warm and dry. No rash noted.   Cardiovascular: Normal heart rate noted  Respiratory: Normal respiratory effort, no problems with respiration noted  Abdomen: Soft, gravid, appropriate for gestational age. Pain/Pressure: Absent     Pelvic:  Cervical  exam deferred        Extremities: Normal range of motion.  Edema: None  ental Status: Normal mood and affect. Normal behavior. Normal judgment and thought content.     Assessment   35y.o. G1P0 at 312w5dy  03/09/2021, by Ultrasound presenting for routine prenatal visit  Plan   pregnancy Problems (from 08/17/20 to present)    Problem Noted Resolved   Supervision of high risk pregnancy, antepartum 08/17/2020 by JaWill BonnetMD No   Overview Addendum 02/07/2021  4:22 PM by VeOrlie PollenCNM     Nursing Staff Provider  Office Location  [facility] Dating   12w [place]Language  English Anatomy US[place]omplete  Flu Vaccine  08/28/2020 Genetic Screen  NIPS: normal xx  TDaP vaccine   01/10/21 Hgb A1C or  GTT Early :83 Third trimester : 81  Rhogam   not needed   LAB RESULTS   Feeding Plan Breast Blood Type O/Positive/-- (09/14 1029)   Contraception  POPs Antibody Negative (09/14 1029)  Circumcision n/a Rubella <0.90 (09/14 1029)  NI  Pediatrician   RPR Non Reactive (09/14 1029)   Support Person  Husband HBsAg Negative (09/14 1029)   Prenatal Classes  declined HIV Non Reactive (09/14 1029)    Varicella Immune  BTL Consent n/a GBS  (For PCN allergy, check sensitivities)        VBAC Consent n/a Pap  2019    Hgb Electro   normal adult hgb  Covid Vaccinated CF      SMA         High risk pregnancy Diagnoses Obesity in pregnancy       Previous Version   Obesity affecting pregnancy 08/17/2020 by JaWill BonnetMD No   Overview Addendum 02/14/2021  3:33 PM by HaKenton Kingfisher  Linton Ham, MD    Weight gain recommendation BMI> 30:  11-20 lbs- discussed [ x] Screen of OSA- positive, referral placed [x ] Early diabetes screening for BMI>30- WNL [x ] Consultation with Anesthesia, 35 weeks done/ approved  Antenatal Testing BMI 40 or > Weekly at 34 weeks        Previous Version      -Reviewed NST with patient  Term labor precautions including but not limited to vaginal bleeding, contractions,  leaking of fluid and fetal movement were reviewed in detail with the patient.    Return in about 1 week (around 02/28/2021) for Needs weekly HROB with NST/AFI for the next two weeks.  Orlie Pollen, CNM, MSN [facility], [facility] 02/21/2021, 10:43 AM

## 2021-02-28 ENCOUNTER — Ambulatory Visit (INDEPENDENT_AMBULATORY_CARE_PROVIDER_SITE_OTHER): Payer: 59 | Admitting: Obstetrics and Gynecology

## 2021-02-28 ENCOUNTER — Encounter: Payer: Self-pay | Admitting: Obstetrics and Gynecology

## 2021-02-28 ENCOUNTER — Other Ambulatory Visit: Payer: Self-pay

## 2021-02-28 VITALS — BP 120/70 | Wt 328.0 lb

## 2021-02-28 DIAGNOSIS — Z3A38 38 weeks gestation of pregnancy: Secondary | ICD-10-CM

## 2021-02-28 DIAGNOSIS — O0990 Supervision of high risk pregnancy, unspecified, unspecified trimester: Secondary | ICD-10-CM

## 2021-02-28 LAB — POCT URINALYSIS DIPSTICK OB
Glucose, UA: NEGATIVE
POC,PROTEIN,UA: NEGATIVE

## 2021-02-28 LAB — FETAL NONSTRESS TEST

## 2021-02-28 NOTE — Progress Notes (Signed)
Routine Prenatal Care Visit  Subjective  Morgan Grant is a 35 y.o. G1P0 at 22w5dbeing seen today for ongoing prenatal care.  She is currently monitored for the following issues for this high-risk pregnancy and has Irregular menses; Environmental and seasonal allergies; BMI 50.0-59.9, adult ([place]; Supervision of high risk pregnancy, antepartum; Obesity affecting pregnancy; and BRCA gene mutation positive on their problem list.  ----------------------------------------------------------------------------------- Patient reports no complaints.   Contractions: Not present. Vag. Bleeding: None.  Movement: Present. Denies leaking of fluid.  ----------------------------------------------------------------------------------- The following portions of the patient's history were reviewed and updated as appropriate: allergies, current medications, past family history, past medical history, past social history, past surgical history and problem list. Problem list updated.   Objective  Blood pressure 120/70, weight (!) 328 lb (148.8 kg), last menstrual period 06/11/2020. Pregravid weight 304 lb (137.9 kg) Total Weight Gain 24 lb (10.9 kg) Urinalysis:      Fetal Status: Fetal Heart Rate (bpm): 125 (RNST) Fundal Height: 39 cm Movement: Present  Presentation: Vertex  General:  Alert, oriented and cooperative. Patient is in no acute distress.  Skin: Skin is warm and dry. No rash noted.   Cardiovascular: Normal heart rate noted  Respiratory: Normal respiratory effort, no problems with respiration noted  Abdomen: Soft, gravid, appropriate for gestational age. Pain/Pressure: Absent     Pelvic:  Cervical exam deferred        Extremities: Normal range of motion.  Edema: None  ental Status: Normal mood and affect. Normal behavior. Normal judgment and thought content.     Assessment   35y.o. G1P0 at 379w5dy  03/09/2021, by Ultrasound presenting for routine prenatal visit  Plan   pregnancy  Problems (from 08/17/20 to present)    Problem Noted Resolved   Supervision of high risk pregnancy, antepartum 08/17/2020 by JaWill BonnetMD No   Overview Addendum 02/21/2021 10:44 AM by VeOrlie PollenCNM     Nursing Staff Provider  Office Location  [facility] Dating   12w US[place]oreaLanguage  English Anatomy US[place]ore[place]omplete  Flu Vaccine  08/28/2020 Genetic Screen  NIPS: normal xx  TDaP vaccine   01/10/21 Hgb A1C or  GTT Early :83 Third trimester : 81  Rhogam   not needed   LAB RESULTS   Feeding Plan Breast Blood Type O/Positive/-- (09/14 1029)   Contraception  POPs Antibody Negative (09/14 1029)  Circumcision n/a Rubella <0.90 (09/14 1029)  NI  Pediatrician   RPR Non Reactive (09/14 1029)   Support Person  Husband HBsAg Negative (09/14 1029)   Prenatal Classes  declined HIV Non Reactive (09/14 1029)    Varicella Immune  BTL Consent n/a GBS  (For PCN allergy, check sensitivities)        VBAC Consent n/a Pap  2019    Hgb Electro   normal adult hgb  Covid Vaccinated CF      SMA         High risk pregnancy Diagnoses Obesity in pregnancy       Previous Version   Obesity affecting pregnancy 08/17/2020 by JaWill BonnetMD No   Overview Addendum 02/14/2021  3:33 PM by HaGae DryMD    Weight gain recommendation BMI> 30:  11-20 lbs- discussed [ x] Screen of OSA- positive, referral placed [x ] Early diabetes screening for BMI>30- WNL [x ] Consultation with Anesthesia, 35 weeks done/ approved  Antenatal Testing BMI 40 or > Weekly at 34 weeks  Previous Version       Term labor precautions including but not limited to vaginal bleeding, contractions, leaking of fluid and fetal movement were reviewed in detail with the patient.    Return in about 1 week (around 03/07/2021) for Keep previously scheduled follow-up.  Orlie Pollen, CNM, MSN [facility], [facility] 02/28/2021, 10:33 AM

## 2021-03-05 ENCOUNTER — Ambulatory Visit (INDEPENDENT_AMBULATORY_CARE_PROVIDER_SITE_OTHER): Payer: 59

## 2021-03-05 ENCOUNTER — Other Ambulatory Visit: Payer: Self-pay

## 2021-03-05 ENCOUNTER — Ambulatory Visit (INDEPENDENT_AMBULATORY_CARE_PROVIDER_SITE_OTHER): Payer: 59 | Admitting: Obstetrics

## 2021-03-05 VITALS — BP 118/72 | Wt 329.0 lb

## 2021-03-05 DIAGNOSIS — Z3A39 39 weeks gestation of pregnancy: Secondary | ICD-10-CM | POA: Diagnosis not present

## 2021-03-05 DIAGNOSIS — O99213 Obesity complicating pregnancy, third trimester: Secondary | ICD-10-CM | POA: Diagnosis not present

## 2021-03-05 DIAGNOSIS — O0990 Supervision of high risk pregnancy, unspecified, unspecified trimester: Secondary | ICD-10-CM

## 2021-03-05 LAB — POCT URINALYSIS DIPSTICK OB
Glucose, UA: NEGATIVE
POC,PROTEIN,UA: NEGATIVE

## 2021-03-05 LAB — FETAL NONSTRESS TEST

## 2021-03-05 NOTE — Progress Notes (Signed)
Routine Prenatal Care Visit  Subjective  Morgan Grant is a 35 y.o. G1P0 at 60w3dbeing seen today for ongoing prenatal care.  She is currently monitored for the following issues for this high-risk pregnancy and has Irregular menses; Environmental and seasonal allergies; BMI 50.0-59.9, adult ([place]; Supervision of high risk pregnancy, antepartum; Obesity affecting pregnancy; and BRCA gene mutation positive on their problem list.  ----------------------------------------------------------------------------------- Patient reports no complaints.  She declines a vaginal exam. Contractions: Not present.  .  Movement: Present. Leaking Fluid denies.  ----------------------------------------------------------------------------------- The following portions of the patient's history were reviewed and updated as appropriate: allergies, current medications, past family history, past medical history, past social history, past surgical history and problem list. Problem list updated.  Objective  Blood pressure 118/72, weight (!) 329 lb (149.2 kg), last menstrual period 06/11/2020. Pregravid weight 304 lb (137.9 kg) Total Weight Gain 25 lb (11.3 kg) Urinalysis: Urine Protein Negative  Urine Glucose Negative  Fetal Status: Fetal Heart Rate (bpm): 134   Movement: Present     General:  Alert, oriented and cooperative. Patient is in no acute distress.  Skin: Skin is warm and dry. No rash noted.   Cardiovascular: Normal heart rate noted  Respiratory: Normal respiratory effort, no problems with respiration noted  Abdomen: Soft, gravid, appropriate for gestational age. Pain/Pressure: Absent     Pelvic:  Cervical exam deferred        Extremities: Normal range of motion.  Edema: None  Mental Status: Normal mood and affect. Normal behavior. Normal judgment and thought content.   Assessment   35y.o. G1P0 at 367w3dy  03/09/2021, by Ultrasound presenting for routine prenatal visit  Plan   pregnancy  Problems (from 08/17/20 to present)    Problem Noted Resolved   Supervision of high risk pregnancy, antepartum 08/17/2020 by JaWill BonnetMD No   Overview Addendum 02/21/2021 10:44 AM by VeOrlie PollenCNM     Nursing Staff Provider  Office Location  [facility] Dating   12w US[place]Language  English Anatomy US[place]omplete  Flu Vaccine  08/28/2020 Genetic Screen  NIPS: normal xx  TDaP vaccine   01/10/21 Hgb A1C or  GTT Early :83 Third trimester : 81  Rhogam   not needed   LAB RESULTS   Feeding Plan Breast Blood Type O/Positive/-- (09/14 1029)   Contraception  POPs Antibody Negative (09/14 1029)  Circumcision n/a Rubella <0.90 (09/14 1029)  NI  Pediatrician   RPR Non Reactive (09/14 1029)   Support Person  Husband HBsAg Negative (09/14 1029)   Prenatal Classes  declined HIV Non Reactive (09/14 1029)    Varicella Immune  BTL Consent n/a GBS  (For PCN allergy, check sensitivities)        VBAC Consent n/a Pap  2019    Hgb Electro   normal adult hgb  Covid Vaccinated CF      SMA         High risk pregnancy Diagnoses Obesity in pregnancy       Previous Version   Obesity affecting pregnancy 08/17/2020 by JaWill BonnetMD No   Overview Addendum 02/14/2021  3:33 PM by HaGae DryMD    Weight gain recommendation BMI> 30:  11-20 lbs- discussed [ x] Screen of OSA- positive, referral placed [x ] Early diabetes screening for BMI>30- WNL [x ] Consultation with Anesthesia, 35 weeks done/ approved  Antenatal Testing BMI 40 or > Weekly at 34 weeks  Previous Version       Term labor symptoms and general obstetric precautions including but not limited to vaginal bleeding, contractions, leaking of fluid and fetal movement were reviewed in detail with the patient. Please refer to After Visit Summary for other counseling recommendations.   Return in about 1 week (around 03/12/2021) for return OB, discuss IOL?Imagene Riches, CNM  03/05/2021 10:56 AM

## 2021-03-09 ENCOUNTER — Inpatient Hospital Stay: Admit: 2021-03-09 | Payer: Self-pay

## 2021-03-14 ENCOUNTER — Other Ambulatory Visit: Payer: Self-pay

## 2021-03-14 ENCOUNTER — Encounter: Payer: Self-pay | Admitting: Obstetrics & Gynecology

## 2021-03-14 ENCOUNTER — Ambulatory Visit (INDEPENDENT_AMBULATORY_CARE_PROVIDER_SITE_OTHER): Payer: 59 | Admitting: Obstetrics & Gynecology

## 2021-03-14 VITALS — BP 120/80 | Wt 328.0 lb

## 2021-03-14 DIAGNOSIS — O99213 Obesity complicating pregnancy, third trimester: Secondary | ICD-10-CM | POA: Diagnosis not present

## 2021-03-14 DIAGNOSIS — Z3A40 40 weeks gestation of pregnancy: Secondary | ICD-10-CM | POA: Diagnosis not present

## 2021-03-14 DIAGNOSIS — O0990 Supervision of high risk pregnancy, unspecified, unspecified trimester: Secondary | ICD-10-CM | POA: Diagnosis not present

## 2021-03-14 LAB — POCT URINALYSIS DIPSTICK OB
Glucose, UA: NEGATIVE
POC,PROTEIN,UA: NEGATIVE

## 2021-03-14 NOTE — Patient Instructions (Addendum)
Labor Induction APR 5 at 0500  PRE ADMISSION TESTING For Covid, prior to procedure Monday 9:00-10:00 [facility] entrance (drive up)  Results in 57-32 hours You will not receive notification if test results are negative. If positive for Covid19, your provider will notify you by phone, with additional instructions.  Labor induction is when steps are taken to cause a pregnant woman to begin the labor process. Most women go into labor on their own between 37 weeks and 42 weeks of pregnancy. When this does not happen, or when there is a medical need for labor to begin, steps may be taken to induce, or bring on, labor. Labor induction causes a pregnant woman's uterus to contract. It also causes the cervix to soften (ripen), open (dilate), and thin out. Usually, labor is not induced before 39 weeks of pregnancy unless there is a medical reason to do so. When is labor induction considered? Labor induction may be right for you if:  Your pregnancy lasts longer than 41 to 42 weeks.  Your placenta is separating from your uterus (placental abruption).  You have a rupture of membranes and your labor does not begin.  You have health problems, like diabetes or high blood pressure (preeclampsia) during your pregnancy.  Your baby has stopped growing or does not have enough amniotic fluid. Before labor induction begins, your health care provider will consider the following factors:  Your medical condition and the baby's condition.  How many weeks you have been pregnant.  How mature the baby's lungs are.  The condition of your cervix.  The position of the baby.  The size of your birth canal. Tell a health care provider about:  Any allergies you have.  All medicines you are taking, including vitamins, herbs, eye drops, creams, and over-the-counter medicines.  Any problems you or your family members have had with anesthetic medicines.  Any surgeries you have had.  Any blood  disorders you have.  Any medical conditions you have. What are the risks? Generally, this is a safe procedure. However, problems may occur, including:  Failed induction.  Changes in fetal heart rate, such as being too high, too low, or irregular (erratic).  Infection in the mother or the baby.  Increased risk of having a cesarean delivery.  Breaking off (abruption) of the placenta from the uterus. This is rare.  Rupture of the uterus. This is very rare.  Your baby could fail to get enough blood flow or oxygen. This can be life-threatening. When induction is needed for medical reasons, the benefits generally outweigh the risks. What happens during the procedure? During the procedure, your health care provider will use one of these methods to induce labor:  Stripping the membranes. In this method, the amniotic sac tissue is gently separated from the cervix. This causes the following to happen: ? Your cervix stretches, which in turn causes the release of prostaglandins. ? Prostaglandins induce labor and cause the uterus to contract. ? This procedure is often done in an office visit. You will be sent home to wait for contractions to begin.  Prostaglandin medicine. This medicine starts contractions and causes the cervix to dilate and ripen. This can be taken by mouth (orally) or by being inserted into the vagina (suppository).  Inserting a small, thin tube (catheter) with a balloon into the vagina and then expanding the balloon with water to dilate the cervix.  Breaking the water. In this method, a small instrument is used to make a small hole in  the amniotic sac. This eventually causes the amniotic sac to break. Contractions should begin within a few hours.  Medicine to trigger or strengthen contractions. This medicine is given through an IV that is inserted into a vein in your arm. This procedure may vary among health care providers and hospitals.   Where to find more  information  March of D[employer]: www.marchofdimes.org  [employer] of Obstetricians and Gynecologists: www.acog.org Summary  Labor induction causes a pregnant woman's uterus to contract. It also causes the cervix to soften (ripen), open (dilate), and thin out.  Labor is usually not induced before 39 weeks of pregnancy unless there is a medical reason to do so.  When induction is needed for medical reasons, the benefits generally outweigh the risks.  Talk with your health care provider about which methods of labor induction are right for you. This information is not intended to replace advice given to you by your health care provider. Make sure you discuss any questions you have with your health care provider. Document Revised: 09/13/2020 Document Reviewed: 09/13/2020 Elsevier Patient Education  2021 [employer].

## 2021-03-14 NOTE — Progress Notes (Signed)
History and Physical  Morgan Grant is a 35 y.o. G1P0 [redacted]w[redacted]d for Induction of Labor scheduled due to Postdates .   See labor record for pregnancy highlights.  No recent pain, bleeding, ruptured membranes, or other signs of progressing labor.  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 Also,  has a past surgical history that includes No past surgeries., family history includes Diabetes in her father and maternal grandmother; Hypertension in her mother; Pancreatic cancer (age of onset: 551 in her father; Pancreatic cancer (age of onset: 79 in her maternal grandmother.,  reports that she has never smoked. She has never used smokeless tobacco. She reports previous alcohol use. She reports that she does not use drugs. She has a current medication list which includes the following prescription(s): omega-3 fatty acids, prenatal multivitamin, and vitamin d-vitamin k. Also, has No Known Allergies. OB History  Gravida Para Term Preterm AB Living  1            SAB IAB Ectopic Multiple Live Births               # Outcome Date GA Lbr Len/2nd Weight Sex Delivery Anes PTL Lv  1 Current           Patient denies any other pertinent gynecologic issues.   Review of Systems  Constitutional: Positive for malaise/fatigue. Negative for chills and fever.  HENT: Negative for congestion, sinus pain and sore throat.   Eyes: Negative for blurred vision and pain.  Respiratory: Negative for cough and wheezing.   Cardiovascular: Negative for chest pain and leg swelling.  Gastrointestinal: Negative for abdominal pain, constipation, diarrhea, heartburn, nausea and vomiting.  Genitourinary: Negative for dysuria, frequency, hematuria and urgency.  Musculoskeletal: Negative for back pain, joint pain, myalgias and neck pain.  Skin: Negative for itching and rash.   Neurological: Negative for dizziness, tremors and weakness.  Endo/Heme/Allergies: Does not bruise/bleed easily.  Psychiatric/Behavioral: Negative for depression. The patient is not nervous/anxious and does not have insomnia.     Objective: BP 120/80   Wt (!) 328 lb (148.8 kg)   LMP 06/11/2020 (Exact Date)   BMI 49.87 kg/m  Physical Exam Constitutional:      General: She is not in acute distress.    Appearance: She is well-developed.  Genitourinary:     No vaginal erythema or bleeding.      Right Adnexa: not tender and no mass present.    Left Adnexa: not tender and no mass present.    No cervical motion tenderness, discharge, polyp or nabothian cyst.     Cervical exam comments: 1/20/ballottable VTX.     Uterus is enlarged.     No uterine mass detected.    Pelvic exam was performed with patient in the lithotomy position.  HENT:     Head: Normocephalic and atraumatic.     Nose: Nose normal.  Abdominal:     General: There is no distension.     Palpations: Abdomen is soft.     Tenderness: There is no abdominal tenderness.  Musculoskeletal:        General: Normal range of motion.  Neurological:     Mental Status: She is alert and oriented to person, place, and time.     Cranial Nerves: No cranial nerve deficit.  Skin:    General: Skin is warm and dry.  Psychiatric:  Attention and Perception: Attention normal.        Mood and Affect: Mood and affect normal.        Speech: Speech normal.        Behavior: Behavior normal.        Thought Content: Thought content normal.        Judgment: Judgment normal.   A NST procedure was performed with FHR monitoring and a normal baseline established, appropriate time of 20-40 minutes of evaluation, and accels >2 seen w 15x15 characteristics.  Results show a REACTIVE NST.   Assessment: Term Pregnancy for Induction of Labor due to Postdates.  Plan: Patient will undergo induction of labor with cervical ripening agents.      Patient has been fully informed of the pros and cons, risks and benefits of continued observation with fetal monitoring versus that of induction of labor.   She understands that there are uncommon risks to induction, which include but are not limited to : frequent or prolonged uterine contractions, fetal distress, uterine rupture, and lack of successful induction.  These risks include all methods including Pitocin and Misoprostol and Cervadil.  Patient understands that using Misoprostol for labor induction is an "off label" indication although it has been studied extensively for this purpose and is an accepted method of induction.  She also has been informed of the increased risks for Cesarean with induction and should induction not be successful.  Patient consents to the induction plan of management.  Plans to breast feed TDaP UTD (01/10/21) GBS Neg  Nursing Staff Provider  Office Location  [facility] Dating   12w [place]  Language  English Anatomy US  complete  Flu Vaccine  08/28/2020 Genetic Screen  NIPS: normal xx  TDaP vaccine   01/10/21 Hgb A1C or  GTT Early :83 Third trimester : 81  Rhogam   not needed   LAB RESULTS   Feeding Plan Breast Blood Type O/Positive/-- (09/14 1029)   Contraception  POPs Antibody Negative (09/14 1029)  Circumcision n/a Rubella <0.90 (09/14 1029)  NI  Pediatrician   RPR Non Reactive (09/14 1029)   Support Person  Husband HBsAg Negative (09/14 1029)   Prenatal Classes  declined HIV Non Reactive (09/14 1029)    Varicella Immune  BTL Consent n/a GBS  (For PCN allergy, check sensitivities)        VBAC Consent n/a Pap  2019    Hgb Electro   normal adult hgb  Covid Vaccinated CF      SMA         High risk pregnancy Diagnoses Obesity in pregnancy  Barnett Applebaum, MD, [place], [facility] 03/14/2021  9:34 AM

## 2021-03-14 NOTE — Progress Notes (Signed)
DIscussed NST and IOL planning. See H&P Fetal movbement counts encourage over next few days prior to IOL Plan NST and AFI Monday prior to IOL Tues. Covid testing discussed and planned  Annamarie Major, MD, [facility], [facility] 03/14/2021  9:38 AM

## 2021-03-14 NOTE — Progress Notes (Signed)
  [facility] INDUCTION ASSESSMENT SCHEDULING Morgan Grant 1985/12/29 Medical record #: [id number] Phone #:  Home Phone [phone]  Mobile [phone]    Prenatal Provider:[facility] Delivering Group:[facility] Proposed admission date/time:03/19/21 0500 Method of induction:Cytotec  Weight: Filed Weights03/31/22 0852Weight:(!) 328 lb (148.8 kg) BMI Body mass index is 49.87 kg/m. HIV Negative HSV Negative EDC Estimated Date of Delivery: 3/26/22based on:LMP  Gestational age on admission: 35 Gravidity/parity:G1P0  Cervix Score   0 1 2 3   Position Posterior Midposition Anterior   Consistency Firm Medium Soft   Effacement (%) 0-30 40-50 60-70 >80  Dilation (cm) Closed 1-2 3-4 >5  Baby's station -3 -2 -1 +1, +2   Bishop Score:4   Medical induction of labor - POST DATES 41 weeks   Provider Signature: Scheduled Morgan Grant Date:03/14/2021 9:31 AM   Call [phone] to finalize the induction date/time  [phone] (07/17)

## 2021-03-18 ENCOUNTER — Other Ambulatory Visit
Admission: RE | Admit: 2021-03-18 | Discharge: 2021-03-18 | Disposition: A | Discharge status: Home / Self Care | Payer: 59 | Source: Ambulatory Visit | Attending: Obstetrics and Gynecology | Admitting: Obstetrics and Gynecology

## 2021-03-18 ENCOUNTER — Ambulatory Visit (INDEPENDENT_AMBULATORY_CARE_PROVIDER_SITE_OTHER): Payer: 59 | Admitting: Obstetrics and Gynecology

## 2021-03-18 ENCOUNTER — Other Ambulatory Visit: Payer: Self-pay

## 2021-03-18 VITALS — BP 120/80 | Wt 327.0 lb

## 2021-03-18 DIAGNOSIS — Z1509 Genetic susceptibility to other malignant neoplasm: Secondary | ICD-10-CM

## 2021-03-18 DIAGNOSIS — O0990 Supervision of high risk pregnancy, unspecified, unspecified trimester: Secondary | ICD-10-CM

## 2021-03-18 DIAGNOSIS — Z1501 Genetic susceptibility to malignant neoplasm of breast: Secondary | ICD-10-CM

## 2021-03-18 DIAGNOSIS — Z20822 Contact with and (suspected) exposure to covid-19: Secondary | ICD-10-CM | POA: Insufficient documentation

## 2021-03-18 DIAGNOSIS — Z01812 Encounter for preprocedural laboratory examination: Secondary | ICD-10-CM | POA: Insufficient documentation

## 2021-03-18 DIAGNOSIS — O99213 Obesity complicating pregnancy, third trimester: Secondary | ICD-10-CM

## 2021-03-18 DIAGNOSIS — Z3A41 41 weeks gestation of pregnancy: Secondary | ICD-10-CM

## 2021-03-18 DIAGNOSIS — Z15060 Genetic susceptibility to colorectal cancer: Secondary | ICD-10-CM

## 2021-03-18 DIAGNOSIS — O480 Post-term pregnancy: Secondary | ICD-10-CM

## 2021-03-18 LAB — FETAL NONSTRESS TEST

## 2021-03-18 LAB — SARS CORONAVIRUS 2 (TAT 6-24 HRS): SARS Coronavirus 2: NEGATIVE

## 2021-03-18 NOTE — Progress Notes (Signed)
Routine Prenatal Care Visit  Subjective  Morgan Grant is a 35 y.o. G1P0 at 74w2dbeing seen today for ongoing prenatal care.  She is currently monitored for the following issues for this high-risk pregnancy and has Irregular menses; Environmental and seasonal allergies; BMI 50.0-59.9, adult (HRafael Hernandez; Supervision of high risk pregnancy, antepartum; Obesity affecting pregnancy; and BRCA gene mutation positive on their problem list.  ----------------------------------------------------------------------------------- Patient reports no complaints.   Contractions: Irregular. Vag. Bleeding: None.  Movement: Present. Denies leaking of fluid.  ----------------------------------------------------------------------------------- The following portions of the patient's history were reviewed and updated as appropriate: allergies, current medications, past family history, past medical history, past social history, past surgical history and problem list. Problem list updated.   Objective  Blood pressure 120/80, weight (!) 327 lb (148.3 kg), last menstrual period 06/11/2020. Pregravid weight 304 lb (137.9 kg) Total Weight Gain 23 lb (10.4 kg)  Body mass index is 49.72 kg/m.  Urinalysis:      Fetal Status: Fetal Heart Rate (bpm): 140   Movement: Present  Presentation: Vertex  General:  Alert, oriented and cooperative. Patient is in no acute distress.  Skin: Skin is warm and dry. No rash noted.   Cardiovascular: Normal heart rate noted  Respiratory: Normal respiratory effort, no problems with respiration noted  Abdomen: Soft, gravid, appropriate for gestational age. Pain/Pressure: Present     Pelvic:  Cervical exam deferred        Extremities: Normal range of motion.     ental Status: Normal mood and affect. Normal behavior. Normal judgment and thought content.   Baseline: 140 Variability: moderate Accelerations: present Decelerations: absent Tocometry: N/A The patient was monitored for  30 minutes, fetal heart rate tracing was deemed reactive, category I tracing,  CPT 5318 [id number]  Assessment   35y.o. G1P0 at 430w2dy  03/09/2021, by Ultrasound presenting for routine prenatal visit  Plan   pregnancy Problems (from 08/17/20 to present)    Problem Noted Resolved   Supervision of high risk pregnancy, antepartum 08/17/2020 by JaWill BonnetMD No   Overview Addendum 03/14/2021  9:37 AM by HaGae DryMD     Nursing Staff Provider  Office Location  [facility] Dating   12w USKoreaLanguage  English Anatomy USKoreacomplete  Flu Vaccine  08/28/2020 Genetic Screen  NIPS: normal xx  TDaP vaccine   01/10/21 Hgb A1C or  GTT Early :83 Third trimester : 81  Rhogam   not needed   LAB RESULTS   Feeding Plan Breast Blood Type O/Positive/-- (09/14 1029)   Contraception  POPs Antibody Negative (09/14 1029)  Circumcision n/a Rubella <0.90 (09/14 1029)  NI  Pediatrician   RPR Non Reactive (09/14 1029)   Support Person  Husband HBsAg Negative (09/14 1029)   Prenatal Classes  declined HIV Non Reactive (09/14 1029)    Varicella Immune  BTL Consent n/a GBS  (For PCN allergy, check sensitivities)        VBAC Consent n/a Pap  2019    Hgb Electro   normal adult hgb  Covid Vaccinated CF      SMA         High risk pregnancy Diagnoses Obesity in pregnancy       Previous Version   Obesity affecting pregnancy 08/17/2020 by JaWill BonnetMD No   Overview Addendum 02/14/2021  3:33 PM by HaGae DryMD    Weight gain recommendation BMI> 30:  11-20 lbs- discussed [ x]  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     IOL tomorrow  Return if symptoms worsen or fail to improve.  Malachy Mood, MD, [facility], [place] 03/18/2021, 10:27 AM

## 2021-03-19 ENCOUNTER — Inpatient Hospital Stay: Payer: 59 | Admitting: Certified Registered Nurse Anesthetist

## 2021-03-19 ENCOUNTER — Inpatient Hospital Stay
Admission: EM | Admit: 2021-03-19 | Discharge: 2021-03-22 | DRG: 787 | Disposition: A | Discharge status: Home / Self Care | Payer: 59 | Attending: Obstetrics and Gynecology | Admitting: Obstetrics and Gynecology

## 2021-03-19 ENCOUNTER — Encounter: Payer: Self-pay | Admitting: Obstetrics & Gynecology

## 2021-03-19 ENCOUNTER — Encounter
Admission: EM | Disposition: A | Discharge status: Home / Self Care | Payer: Self-pay | Source: Home / Self Care | Attending: Obstetrics and Gynecology

## 2021-03-19 DIAGNOSIS — Z20822 Contact with and (suspected) exposure to covid-19: Secondary | ICD-10-CM | POA: Diagnosis present

## 2021-03-19 DIAGNOSIS — O9081 Anemia of the puerperium: Secondary | ICD-10-CM | POA: Diagnosis not present

## 2021-03-19 DIAGNOSIS — O99213 Obesity complicating pregnancy, third trimester: Secondary | ICD-10-CM

## 2021-03-19 DIAGNOSIS — O480 Post-term pregnancy: Secondary | ICD-10-CM | POA: Diagnosis present

## 2021-03-19 DIAGNOSIS — O99214 Obesity complicating childbirth: Secondary | ICD-10-CM | POA: Diagnosis present

## 2021-03-19 DIAGNOSIS — Z23 Encounter for immunization: Secondary | ICD-10-CM

## 2021-03-19 DIAGNOSIS — E669 Obesity, unspecified: Secondary | ICD-10-CM | POA: Diagnosis not present

## 2021-03-19 DIAGNOSIS — Z3A41 41 weeks gestation of pregnancy: Secondary | ICD-10-CM

## 2021-03-19 DIAGNOSIS — O0990 Supervision of high risk pregnancy, unspecified, unspecified trimester: Secondary | ICD-10-CM

## 2021-03-19 DIAGNOSIS — D62 Acute posthemorrhagic anemia: Secondary | ICD-10-CM | POA: Diagnosis not present

## 2021-03-19 LAB — CBC
HCT: 42.7 % (ref 36.0–46.0)
Hemoglobin: 14.8 g/dL (ref 12.0–15.0)
MCH: 33.3 pg (ref 26.0–34.0)
MCHC: 34.7 g/dL (ref 30.0–36.0)
MCV: 96.2 fL (ref 80.0–100.0)
Platelets: 292 10*3/uL (ref 150–400)
RBC: 4.44 MIL/uL (ref 3.87–5.11)
RDW: 12.3 % (ref 11.5–15.5)
WBC: 10.5 10*3/uL (ref 4.0–10.5)
nRBC: 0 % (ref 0.0–0.2)

## 2021-03-19 LAB — TYPE AND SCREEN
ABO/RH(D): O POS
Antibody Screen: NEGATIVE

## 2021-03-19 LAB — RPR: RPR Ser Ql: NONREACTIVE

## 2021-03-19 LAB — ABO/RH: ABO/RH(D): O POS

## 2021-03-19 SURGERY — Surgical Case
Anesthesia: Epidural

## 2021-03-19 MED ORDER — EPHEDRINE 5 MG/ML INJ: 10.0000 mg | INTRAVENOUS | Status: DC | PRN

## 2021-03-19 MED ORDER — BUTORPHANOL TARTRATE 1 MG/ML IJ SOLN: 1.0000 mg | INTRAMUSCULAR | Status: DC | PRN

## 2021-03-19 MED ORDER — ACETAMINOPHEN 325 MG PO TABS: 650.0000 mg | ORAL_TABLET | ORAL | Status: DC | PRN

## 2021-03-19 MED ORDER — OXYTOCIN-SODIUM CHLORIDE 30-0.9 UT/500ML-% IV SOLN
1.0000 m[IU]/min | INTRAVENOUS | Status: DC
  Administered 2021-03-19: 2 m[IU]/min via INTRAVENOUS

## 2021-03-19 MED ORDER — MISOPROSTOL 200 MCG PO TABS
ORAL_TABLET | ORAL | Status: AC
  Filled 2021-03-19: qty 4

## 2021-03-19 MED ORDER — LIDOCAINE HCL (PF) 1 % IJ SOLN
INTRAMUSCULAR | Status: DC | PRN
  Administered 2021-03-19: 2 mL

## 2021-03-19 MED ORDER — LIDOCAINE HCL (PF) 1 % IJ SOLN
30.0000 mL | INTRAMUSCULAR | Status: DC | PRN
  Filled 2021-03-19: qty 30

## 2021-03-19 MED ORDER — ONDANSETRON HCL 4 MG/2ML IJ SOLN: 4.0000 mg | Freq: Four times a day (QID) | INTRAMUSCULAR | Status: DC | PRN

## 2021-03-19 MED ORDER — PHENYLEPHRINE HCL (PRESSORS) 10 MG/ML IV SOLN
INTRAVENOUS | Status: DC | PRN
  Administered 2021-03-19: 200 ug via INTRAVENOUS

## 2021-03-19 MED ORDER — LIDOCAINE-EPINEPHRINE (PF) 1.5 %-1:200000 IJ SOLN
INTRAMUSCULAR | Status: DC | PRN
Start: 2021-03-19 — End: 2021-03-20
  Administered 2021-03-19: 5 mL via PERINEURAL

## 2021-03-19 MED ORDER — BUPIVACAINE IN DEXTROSE 0.75-8.25 % IT SOLN
INTRATHECAL | Status: DC | PRN
  Administered 2021-03-19: 1.3 mL via INTRATHECAL

## 2021-03-19 MED ORDER — MORPHINE SULFATE (PF) 0.5 MG/ML IJ SOLN
INTRAMUSCULAR | Status: DC | PRN
  Administered 2021-03-19: .1 mg via INTRATHECAL

## 2021-03-19 MED ORDER — DEXTROSE 5 % IV SOLN
3.0000 g | INTRAVENOUS | Status: AC
  Administered 2021-03-19: 3 g via INTRAVENOUS
  Filled 2021-03-19: qty 3

## 2021-03-19 MED ORDER — FENTANYL CITRATE (PF) 100 MCG/2ML IJ SOLN
INTRAMUSCULAR | Status: AC
  Filled 2021-03-19: qty 2

## 2021-03-19 MED ORDER — PHENYLEPHRINE 40 MCG/ML (10ML) SYRINGE FOR IV PUSH (FOR BLOOD PRESSURE SUPPORT): 80.0000 ug | PREFILLED_SYRINGE | INTRAVENOUS | Status: DC | PRN

## 2021-03-19 MED ORDER — SOD CITRATE-CITRIC ACID 500-334 MG/5ML PO SOLN
ORAL | Status: AC
  Filled 2021-03-19: qty 15

## 2021-03-19 MED ORDER — BUPIVACAINE HCL (PF) 0.5 % IJ SOLN
INTRAMUSCULAR | Status: AC
  Filled 2021-03-19: qty 30

## 2021-03-19 MED ORDER — BUPIVACAINE 0.25 % ON-Q PUMP DUAL CATH 400 ML
400.0000 mL | INJECTION | Status: DC
  Filled 2021-03-19: qty 400

## 2021-03-19 MED ORDER — FENTANYL 2.5 MCG/ML W/ROPIVACAINE 0.15% IN NS 100 ML EPIDURAL (ARMC)
EPIDURAL | Status: AC
  Filled 2021-03-19: qty 100

## 2021-03-19 MED ORDER — MORPHINE SULFATE (PF) 0.5 MG/ML IJ SOLN
INTRAMUSCULAR | Status: AC
  Filled 2021-03-19: qty 10

## 2021-03-19 MED ORDER — TERBUTALINE SULFATE 1 MG/ML IJ SOLN: 0.2500 mg | Freq: Once | INTRAMUSCULAR | Status: DC | PRN

## 2021-03-19 MED ORDER — MISOPROSTOL 25 MCG QUARTER TABLET
25.0000 ug | ORAL_TABLET | ORAL | Status: DC | PRN
  Filled 2021-03-19: qty 1

## 2021-03-19 MED ORDER — EPHEDRINE 5 MG/ML INJ
10.0000 mg | INTRAVENOUS | Status: DC | PRN
  Administered 2021-03-19: 10 mg via INTRAVENOUS
  Filled 2021-03-19: qty 4

## 2021-03-19 MED ORDER — OXYTOCIN-SODIUM CHLORIDE 30-0.9 UT/500ML-% IV SOLN
INTRAVENOUS | Status: DC | PRN
  Administered 2021-03-19: 1 mL via INTRAVENOUS
  Administered 2021-03-20: 199 mL via INTRAVENOUS

## 2021-03-19 MED ORDER — BUPIVACAINE HCL 0.5 % IJ SOLN
20.0000 mL | INTRAMUSCULAR | Status: DC
  Filled 2021-03-19: qty 20

## 2021-03-19 MED ORDER — LACTATED RINGERS IV SOLN: INTRAVENOUS | Status: DC

## 2021-03-19 MED ORDER — PHENYLEPHRINE HCL-NACL 20-0.9 MG/250ML-% IV SOLN
INTRAVENOUS | Status: DC | PRN
  Administered 2021-03-19: 40 ug/min via INTRAVENOUS

## 2021-03-19 MED ORDER — SOD CITRATE-CITRIC ACID 500-334 MG/5ML PO SOLN
30.0000 mL | ORAL | Status: AC
  Administered 2021-03-19: 30 mL via ORAL

## 2021-03-19 MED ORDER — CEFAZOLIN SODIUM-DEXTROSE 2-4 GM/100ML-% IV SOLN
INTRAVENOUS | Status: AC
  Filled 2021-03-19: qty 100

## 2021-03-19 MED ORDER — FENTANYL 2.5 MCG/ML W/ROPIVACAINE 0.15% IN NS 100 ML EPIDURAL (ARMC)
12.0000 mL/h | EPIDURAL | Status: DC
  Administered 2021-03-19: 12 mL/h via EPIDURAL

## 2021-03-19 MED ORDER — OXYTOCIN-SODIUM CHLORIDE 30-0.9 UT/500ML-% IV SOLN
2.5000 [IU]/h | INTRAVENOUS | Status: DC
  Filled 2021-03-19: qty 500

## 2021-03-19 MED ORDER — BUPIVACAINE HCL (PF) 0.25 % IJ SOLN
INTRAMUSCULAR | Status: DC | PRN
  Administered 2021-03-19 (×2): 4 mL via EPIDURAL

## 2021-03-19 MED ORDER — LACTATED RINGERS IV SOLN: INTRAVENOUS | Status: DC | PRN

## 2021-03-19 MED ORDER — LACTATED RINGERS IV SOLN
500.0000 mL | Freq: Once | INTRAVENOUS | Status: AC
  Administered 2021-03-19: 500 mL via INTRAVENOUS

## 2021-03-19 MED ORDER — EPHEDRINE SULFATE 50 MG/ML IJ SOLN
INTRAMUSCULAR | Status: DC | PRN
  Administered 2021-03-19: 10 mg via INTRAVENOUS

## 2021-03-19 MED ORDER — AMMONIA AROMATIC IN INHA
RESPIRATORY_TRACT | Status: AC
  Filled 2021-03-19: qty 10

## 2021-03-19 MED ORDER — LACTATED RINGERS IV SOLN
500.0000 mL | INTRAVENOUS | Status: DC | PRN
  Administered 2021-03-19: 500 mL via INTRAVENOUS

## 2021-03-19 MED ORDER — FENTANYL CITRATE (PF) 100 MCG/2ML IJ SOLN
INTRAMUSCULAR | Status: DC | PRN
  Administered 2021-03-19: 15 ug via INTRATHECAL
  Administered 2021-03-20: 85 ug via INTRATHECAL

## 2021-03-19 MED ORDER — OXYTOCIN 10 UNIT/ML IJ SOLN
INTRAMUSCULAR | Status: AC
  Filled 2021-03-19: qty 2

## 2021-03-19 MED ORDER — DIPHENHYDRAMINE HCL 50 MG/ML IJ SOLN: 12.5000 mg | INTRAMUSCULAR | Status: DC | PRN

## 2021-03-19 MED ORDER — LIDOCAINE HCL (PF) 2 % IJ SOLN
INTRAMUSCULAR | Status: AC
  Filled 2021-03-19: qty 5

## 2021-03-19 MED ORDER — OXYTOCIN BOLUS FROM INFUSION: 333.0000 mL | Freq: Once | INTRAVENOUS | Status: DC

## 2021-03-19 SURGICAL SUPPLY — 39 items
ADH SKN CLS APL DERMABOND .7 (GAUZE/BANDAGES/DRESSINGS) ×2
APL PRP STRL LF DISP 70% ISPRP (MISCELLANEOUS) ×2
BAG COUNTER SPONGE EZ (MISCELLANEOUS) ×2 IMPLANT
BAG SPNG 4X4 CLR HAZ (MISCELLANEOUS) ×1
CANISTER SUCT 3000ML PPV (MISCELLANEOUS) ×2 IMPLANT
CATH KIT ON-Q SILVERSOAK 5IN (CATHETERS) ×4 IMPLANT
CHLORAPREP W/TINT 26 (MISCELLANEOUS) ×4 IMPLANT
DERMABOND ADVANCED (GAUZE/BANDAGES/DRESSINGS) ×2
DERMABOND ADVANCED .7 DNX12 (GAUZE/BANDAGES/DRESSINGS) ×2 IMPLANT
DRSG OPSITE POSTOP 4X10 (GAUZE/BANDAGES/DRESSINGS) ×2 IMPLANT
DRSG TELFA 3X8 NADH (GAUZE/BANDAGES/DRESSINGS) ×2 IMPLANT
ELECT CAUTERY BLADE 6.4 (BLADE) ×2 IMPLANT
ELECT REM PT RETURN 9FT ADLT (ELECTROSURGICAL) ×2
ELECTRODE REM PT RTRN 9FT ADLT (ELECTROSURGICAL) ×1 IMPLANT
GAUZE SPONGE 4X4 12PLY STRL (GAUZE/BANDAGES/DRESSINGS) ×2 IMPLANT
GLOVE SURG ENC MOIS LTX SZ7 (GLOVE) ×2 IMPLANT
GLOVE SURG UNDER LTX SZ7.5 (GLOVE) ×2 IMPLANT
GOWN STRL REUS W/ TWL LRG LVL3 (GOWN DISPOSABLE) ×3 IMPLANT
GOWN STRL REUS W/TWL LRG LVL3 (GOWN DISPOSABLE) ×6
MANIFOLD NEPTUNE II (INSTRUMENTS) ×2 IMPLANT
MAT PREVALON FULL STRYKER (MISCELLANEOUS) ×2 IMPLANT
NS IRRIG 1000ML POUR BTL (IV SOLUTION) ×2 IMPLANT
PACK C SECTION AR (MISCELLANEOUS) ×2 IMPLANT
PAD OB MATERNITY 4.3X12.25 (PERSONAL CARE ITEMS) ×2 IMPLANT
PAD PREP 24X41 OB/GYN DISP (PERSONAL CARE ITEMS) ×2 IMPLANT
PENCIL SMOKE EVACUATOR (MISCELLANEOUS) ×2 IMPLANT
RETRACTOR TRAXI PANNICULUS (MISCELLANEOUS) ×1 IMPLANT
STAPLER INSORB 30 2030 C-SECTI (MISCELLANEOUS) ×2 IMPLANT
STRIP CLOSURE SKIN 1/2X4 (GAUZE/BANDAGES/DRESSINGS) ×2 IMPLANT
SUT 2-0 PL GUT LIGAPAK (SUTURE) ×2 IMPLANT
SUT MNCRL 4-0 (SUTURE) ×2
SUT MNCRL 4-018XMFL (SUTURE) ×1
SUT MNCRL AB 4-0 PS2 18 (SUTURE) ×2 IMPLANT
SUT PDS AB 1 TP1 96 (SUTURE) ×4 IMPLANT
SUT VIC AB 0 CTX 36 (SUTURE) ×4
SUT VIC AB 0 CTX36XBRD ANBCTRL (SUTURE) ×2 IMPLANT
SUT VIC AB 2-0 CT1 36 (SUTURE) ×2 IMPLANT
SUTURE MNCRL 4-018XMF (SUTURE) ×1 IMPLANT
TRAXI PANNICULUS RETRACTOR (MISCELLANEOUS) ×1

## 2021-03-19 NOTE — Progress Notes (Signed)
   Subjective:  Painfully contracting  Objective:   Vitals: Blood pressure 108/84, pulse 75, temperature 98.1 F (36.7 C), temperature source Oral, resp. rate 18, height 5\' 9"  (1.753 m), weight (!) 148.3 kg, last menstrual period 06/11/2020. General: NAD Abdomen: gravid, non-tender Cervical Exam:  Dilation: 5 Effacement (%): 80 Cervical Position: Middle Station: -2 Presentation: Vertex Exam by:: Masud Holub,MD  FHT: 140, moderate, +accels, several lates 13:52 that changed with position changes Toco: q28min  Results for orders placed or performed during the hospital encounter of 03/19/21 (from the past 24 hour(s))  CBC     Status: None   Collection Time: 03/19/21  5:43 AM  Result Value Ref Range   WBC 10.5 4.0 - 10.5 K/uL   RBC 4.44 3.87 - 5.11 MIL/uL   Hemoglobin 14.8 12.0 - 15.0 g/dL   HCT 05/19/21 37.9 - 02.4 %   MCV 96.2 80.0 - 100.0 fL   MCH 33.3 26.0 - 34.0 pg   MCHC 34.7 30.0 - 36.0 g/dL   RDW 09.7 35.3 - 29.9 %   Platelets 292 150 - 400 K/uL   nRBC 0.0 0.0 - 0.2 %  Type and screen     Status: None   Collection Time: 03/19/21  5:43 AM  Result Value Ref Range   ABO/RH(D) O POS    Antibody Screen NEG    Sample Expiration      03/22/2021,2359 Performed at [facility], [address] [place], [place]   RPR     Status: None   Collection Time: 03/19/21  5:43 AM  Result Value Ref Range   RPR Ser Ql NON REACTIVE NON REACTIVE  ABO/Rh     Status: None   Collection Time: 03/19/21  8:25 AM  Result Value Ref Range   ABO/RH(D)      O POS Performed at [facility], [address], [place], [place]     Assessment:   35 y.o. G1P0 [redacted]w[redacted]d   Plan:   1) Labor - continue to titrate pitocin.  Some effacement and descent in station since AROM  2) Fetus - cat I tracing currently periods of cat II, continue to monitor.    [redacted]w[redacted]d, MD, [facility], [facility] 03/19/2021, 2:50 PM

## 2021-03-19 NOTE — Progress Notes (Signed)
Prolonged decel with foley placement while patient was on back, improvement in position change back to right lateral.  Discussed unable to achieve category I tracing and given where we were in the labor course my recommendation would be to proceed with 1LTCS for delivery.

## 2021-03-19 NOTE — Progress Notes (Signed)
   Subjective:  Feeling increasing strength of contractions  Objective:   Vitals: Blood pressure 129/70, pulse 73, temperature 99.1 F (37.3 C), temperature source Oral, resp. rate 18, height 5\' 9"  (1.753 m), weight (!) 148.3 kg, last menstrual period 06/11/2020. Body mass index is 48.29 kg/m.  General: NAD Abdomen: gravid, non-tender Cervical Exam:  Dilation: 5 Effacement (%): 70 Cervical Position: Middle Station: -3,-2 Presentation: Vertex Exam by:: Shasta Chinn,MD  FHT: 125, moderate, +accels, no decels Toco: not tracing well  Results for orders placed or performed during the hospital encounter of 03/19/21 (from the past 24 hour(s))  CBC     Status: None   Collection Time: 03/19/21  5:43 AM  Result Value Ref Range   WBC 10.5 4.0 - 10.5 K/uL   RBC 4.44 3.87 - 5.11 MIL/uL   Hemoglobin 14.8 12.0 - 15.0 g/dL   HCT 05/19/21 94.8 - 54.6 %   MCV 96.2 80.0 - 100.0 fL   MCH 33.3 26.0 - 34.0 pg   MCHC 34.7 30.0 - 36.0 g/dL   RDW 27.0 35.0 - 09.3 %   Platelets 292 150 - 400 K/uL   nRBC 0.0 0.0 - 0.2 %  Type and screen     Status: None   Collection Time: 03/19/21  5:43 AM  Result Value Ref Range   ABO/RH(D) O POS    Antibody Screen NEG    Sample Expiration      03/22/2021,2359 Performed at [facility], [address] [place], [place]   ABO/Rh     Status: None   Collection Time: 03/19/21  8:25 AM  Result Value Ref Range   ABO/RH(D)      O POS Performed at [facility], [address], [place], [place]     Assessment:   35 y.o. G1P0 [redacted]w[redacted]d IOL postdates  Plan:   1) Labor - AROM clear, continue to titrate pitocin, internal places (FSE, IUPC)  2) Fetus - cat I tracing  [redacted]w[redacted]d, MD, [facility], [facility] 03/19/2021, 12:32 PM

## 2021-03-19 NOTE — Anesthesia Procedure Notes (Addendum)
Epidural Patient location during procedure: OB Start time: 03/19/2021 3:10 PM End time: 03/19/2021 3:28 PM  Staffing Anesthesiologist: Yves Dill, MD Resident/CRNA: Stormy Fabian, CRNA Performed: resident/CRNA   Preanesthetic Checklist Completed: patient identified, IV checked, site marked, risks and benefits discussed, surgical consent, monitors and equipment checked, pre-op evaluation and timeout performed  Epidural Patient position: sitting Prep: ChloraPrep Patient monitoring: heart rate, continuous pulse ox and blood pressure Approach: midline Location: L3-L4 Injection technique: LOR saline  Needle:  Needle type: Tuohy  Needle gauge: 17 G Needle length: 15 cm and 9 Catheter type: closed end flexible Catheter size: 19 Gauge Test dose: negative and 1.5% lidocaine with Epi 1:200 K  Assessment Sensory level: T10 Events: blood not aspirated, injection not painful, no injection resistance, no paresthesia and negative IV test  Additional Notes 1 attempt Pt. Evaluated and documentation done after procedure finished. Patient identified. Risks/Benefits/Options discussed with patient including but not limited to bleeding, infection, nerve damage, paralysis, failed block, incomplete pain control, headache, blood pressure changes, nausea, vomiting, reactions to medication both or allergic, itching and postpartum back pain. Confirmed with bedside nurse the patient's most recent platelet count. Confirmed with patient that they are not currently taking any anticoagulation, have any bleeding history or any family history of bleeding disorders. Patient expressed understanding and wished to proceed. All questions were answered. Sterile technique was used throughout the entire procedure. Please see nursing notes for vital signs. Test dose was given through epidural catheter and negative prior to continuing to dose epidural or start infusion. Warning signs of high block given to the patient  including shortness of breath, tingling/numbness in hands, complete motor block, or any concerning symptoms with instructions to call for help. Patient was given instructions on fall risk and not to get out of bed. All questions and concerns addressed with instructions to call with any issues or inadequate analgesia.   Patient tolerated the insertion well without immediate complications.Reason for block:procedure for pain

## 2021-03-19 NOTE — Anesthesia Procedure Notes (Addendum)
Spinal  Patient location during procedure: OR Start time: 03/19/2021 11:00 PM End time: 03/19/2021 11:16 PM Reason for block: surgical anesthesia Staffing Performed: anesthesiologist  Anesthesiologist: Martha Clan, MD Preanesthetic Checklist Completed: patient identified, IV checked, site marked, risks and benefits discussed, surgical consent, monitors and equipment checked, pre-op evaluation and timeout performed Spinal Block Patient position: sitting Prep: ChloraPrep Patient monitoring: heart rate, cardiac monitor, continuous pulse ox and blood pressure Approach: midline Location: L3-4 Injection technique: single-shot Needle Needle type: Sprotte  Needle gauge: 22 G Needle length: 15 cm Assessment Sensory level: T4 Events: CSF return Additional Notes IV functioning, monitors applied to pt. Expiration date of kit checked and confirmed to be in date. Sterile prep and drape, hand hygiene and sterile gloved used. Pt was positioned and spine was prepped in sterile fashion. Skin was anesthetized with lidocaine. Free flow of clear CSF obtained prior to injecting local anesthetic into CSF x 1 attempt. Spinal needle aspirated freely following injection. Needle was carefully withdrawn, and pt tolerated procedure well. Loss of motor and sensory on exam post injection.   Bent several 25g 15cm sprotte needles with numerous attempts at spinal placement.  After those numerous failed attempts I switched over to the 22g sprotte as it would give more rigidity with spinal attempts.  I warned the patient of increased risk of spinal headache, but she agreed to the attempt.  Successful attempt with 22g sprotte.  Given the patient's BMI she should be protected from PDPH, but we will assess her 24 hours post c-section.

## 2021-03-19 NOTE — Progress Notes (Signed)
   Subjective:  Comfortable epidural in place  Objective:   Vitals: Blood pressure (!) 122/47, pulse 67, temperature 98.4 F (36.9 C), temperature source Oral, resp. rate 18, height 5\' 9"  (1.753 m), weight (!) 148.3 kg, last menstrual period 06/11/2020, SpO2 100 %. General: NAD Abdomen:gravid, non-tender Cervical Exam:  Dilation: 5 Effacement (%): 80 Cervical Position: Middle Station: -2 Presentation: Vertex Exam by:: Manly Nestle,MD  FHT: 140, moderate, two prolonged deceleration into 70's and several late decelerations.  Theses resolved with position and changes and cessation of pitocin. Toco: q2-89min  Results for orders placed or performed during the hospital encounter of 03/19/21 (from the past 24 hour(s))  CBC     Status: None   Collection Time: 03/19/21  5:43 AM  Result Value Ref Range   WBC 10.5 4.0 - 10.5 K/uL   RBC 4.44 3.87 - 5.11 MIL/uL   Hemoglobin 14.8 12.0 - 15.0 g/dL   HCT 05/19/21 03.5 - 00.9 %   MCV 96.2 80.0 - 100.0 fL   MCH 33.3 26.0 - 34.0 pg   MCHC 34.7 30.0 - 36.0 g/dL   RDW 38.1 82.9 - 93.7 %   Platelets 292 150 - 400 K/uL   nRBC 0.0 0.0 - 0.2 %  Type and screen     Status: None   Collection Time: 03/19/21  5:43 AM  Result Value Ref Range   ABO/RH(D) O POS    Antibody Screen NEG    Sample Expiration      03/22/2021,2359 Performed at [facility], [address] [place], [place]   RPR     Status: None   Collection Time: 03/19/21  5:43 AM  Result Value Ref Range   RPR Ser Ql NON REACTIVE NON REACTIVE  ABO/Rh     Status: None   Collection Time: 03/19/21  8:25 AM  Result Value Ref Range   ABO/RH(D)      O POS Performed at [facility], [address] [place], [place]     Assessment:   35 y.o. G1P0 [redacted]w[redacted]d IOL postdates  Plan:   1) Labor - no further cervical changes.  Discussed average cervical change in a primigravida 1.5cm/2hrs.  We discussed the 3 P's assessed in this situation.  MVU's have been  adequate prior to turning of pitocin secondary to fetal status.  Pelvis adequate on pelvimetry.  Growth scan 02/14/2021 at [redacted]w[redacted]d 7lbs Ioz (3200g) or 73%ile.  Assuming normal growth EFW predicted to be 4000g to 4200g. If failure to progress despite adequate MVU we discussed the diagnosis of second stage arrest.    2) Fetus - category II tracing. Late deceration stopped after position changes and stopping pitocin.  Will allow to recover and restart.  We discussed implications of late deceleration, generally assumed to be a sign of placental malperfusion.  Blood pressures have remained stable following epidural no evidence of hypotension.  Position changes may be helpful in optimizing uterine and placental blood flow. Given gestational age also increased risk of placental disfunction.   If evidence of fetal intolerance on restart of pitocin we discussed need to proceed with cesarean section.    [redacted]w[redacted]d, MD, [facility], [facility] 03/19/2021, 4:50 PM

## 2021-03-19 NOTE — Anesthesia Preprocedure Evaluation (Addendum)
Anesthesia Evaluation   Patient awake    Reviewed: Allergy & Precautions, H&P , NPO status , Patient's Chart, lab work & pertinent test results  History of Anesthesia Complications Negative for: history of anesthetic complications  Airway Mallampati: III   Neck ROM: full    Dental no notable dental hx.    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  Reproductive/Obstetrics (+) Pregnancy                            Anesthesia Physical Anesthesia Plan  ASA: III  Anesthesia Plan: Spinal   Post-op Pain Management:    Induction:   PONV Risk Score and Plan:   Airway Management Planned:   Additional Equipment:   Intra-op Plan:   Post-operative Plan:   Informed Consent: I have reviewed the patients History and Physical, chart, labs and discussed the procedure including the risks, benefits and alternatives for the proposed anesthesia with the patient or authorized representative who has indicated his/her understanding and acceptance.       Plan Discussed with: Anesthesiologist and CRNA  Anesthesia Plan Comments:         Anesthesia Quick Evaluation

## 2021-03-19 NOTE — Progress Notes (Signed)
  Labor Progress Note   35 y.o. G1P0 @ [redacted]w[redacted]d , admitted for  Pregnancy, Labor Management.   Subjective:  Uncomfortable with irregular contractions and coping well. We discussed rupturing membranes vs starting pitocin. Patient prefers to wait 1 hour prior to intervention.   Objective:  BP 124/76 (BP Location: Left Arm)   Pulse 77   Temp 98.2 F (36.8 C) (Oral)   Resp 16   Ht 5\' 9"  (1.753 m)   Wt (!) 148.3 kg   LMP 06/11/2020 (Exact Date)   BMI 48.29 kg/m  Abd: gravid, ND, FHT present, mild tenderness on exam Extr: no edema SVE: deferred, 4/70/-3 1 hour ago per RN exam  EFM: FHR: 130 bpm, variability: moderate,  accelerations:  Present,  decelerations:  Absent Toco: Frequency: Every 2-10 minutes Labs: I have reviewed the patient's lab results.   Assessment & Plan:  G1P0 @ [redacted]w[redacted]d, admitted for  Pregnancy and Labor/Delivery Management  1. Pain management: none. 2. FWB: FHT category I.  3. ID: GBS negative 4. Labor management: Consider AROM/pitocin at next check  All discussed with patient, see orders   [redacted]w[redacted]d, CNM [facility] 03/19/2021  7:51 AM

## 2021-03-19 NOTE — Progress Notes (Signed)
   Subjective:  Still comfortable with epidural.  No increase in pressure.    Objective:   Vitals: Blood pressure (!) 109/50, pulse 72, temperature 98.4 F (36.9 C), temperature source Oral, resp. rate 17, height 5\' 9"  (1.753 m), weight (!) 148.3 kg, last menstrual period 06/11/2020, SpO2 100 %. General: NAD Abdomen: gravid, non-tender Cervical Exam:  Dilation: 5 Effacement (%): 80 Cervical Position: Middle Station: -2 Presentation: Vertex Exam by:: Layan Zalenski MD  FHT: 145, moderate, +accels, no decels Toco: q26min  Results for orders placed or performed during the hospital encounter of 03/19/21 (from the past 24 hour(s))  CBC     Status: None   Collection Time: 03/19/21  5:43 AM  Result Value Ref Range   WBC 10.5 4.0 - 10.5 K/uL   RBC 4.44 3.87 - 5.11 MIL/uL   Hemoglobin 14.8 12.0 - 15.0 g/dL   HCT 05/19/21 72.5 - 36.6 %   MCV 96.2 80.0 - 100.0 fL   MCH 33.3 26.0 - 34.0 pg   MCHC 34.7 30.0 - 36.0 g/dL   RDW 44.0 34.7 - 42.5 %   Platelets 292 150 - 400 K/uL   nRBC 0.0 0.0 - 0.2 %  Type and screen     Status: None   Collection Time: 03/19/21  5:43 AM  Result Value Ref Range   ABO/RH(D) O POS    Antibody Screen NEG    Sample Expiration      03/22/2021,2359 Performed at [facility], [address] [place], [place]   RPR     Status: None   Collection Time: 03/19/21  5:43 AM  Result Value Ref Range   RPR Ser Ql NON REACTIVE NON REACTIVE  ABO/Rh     Status: None   Collection Time: 03/19/21  8:25 AM  Result Value Ref Range   ABO/RH(D)      O POS Performed at [facility], [address] [place], [place]     Assessment:   35 y.o. G1P0 [redacted]w[redacted]d IOL postdates  Plan:   1) Labor - no further cervical change patient amenable with proceeding with 1LTCS.    2) Fetus - category I tracing but intermittent decels self limited.  The patient was counseled regarding risk and benefits to proceeding with Cesarean section to expedite delivery.   Risk of cesarean section were discussed including risk of bleeding and need for potential intraoperative or postoperative blood transfusion with a rate of approximately 5% quoted for all Cesarean sections, risk of injury to adjacent organs including but not limited to bowl and bladder, the need for additional surgical procedures to address such injuries, and the risk of infection.  The risk of continued attempts at vaginal delivery include but are note limited to worsening fetal or maternal status.  After consideration of options the patient is amenable to proceed with primary cesarean section for delivery.   [redacted]w[redacted]d, MD, [facility], [facility] 03/19/2021, 9:05 PM

## 2021-03-19 NOTE — Progress Notes (Signed)
Labor Progress Note  Alondria Mousseau is a 35 y.o. G1P0 at [redacted]w[redacted]d by 12 week ultrasound admitted for induction of labor due to Post dates. Due date 03/09/21.  Subjective: Patient has been ambulating regularly throughout the morning. Breathing through contractions. Reports experiencing contractions about every 7-10 minutes.  Objective: BP 129/70 (BP Location: Left Arm)   Pulse 73   Temp 99.1 F (37.3 C) (Oral)   Resp 18   Ht 5\' 9"  (1.753 m)   Wt (!) 148.3 kg   LMP 06/11/2020 (Exact Date)   BMI 48.29 kg/m   Fetal Assessment: FHT:  FHR: 125 bpm, variability: moderate,  accelerations:  Present,  decelerations:  Absent Category/reactivity:  Category I UC:   irregular, every 7-10 minutes SVE:   5/70/-3 Membrane status: Intact Amniotic color: n/a  Labs: Lab Results  Component Value Date   WBC 10.5 03/19/2021   HGB 14.8 03/19/2021   HCT 42.7 03/19/2021   MCV 96.2 03/19/2021   PLT 292 03/19/2021    Assessment / Plan: Induction of labor due to postterm  Labor: Will initiate pitocin infusion and reassess for fetal decent prior to AROM Preeclampsia:  no S&S of PIH Fetal Wellbeing:  Category I Pain Control:  Labor support without medications I/D:  GBS negative; Covid-19 negative Anticipated MOD:  NSVD  05/19/2021, CNM 03/19/2021, 9:46 AM

## 2021-03-20 ENCOUNTER — Encounter: Payer: Self-pay | Admitting: Obstetrics and Gynecology

## 2021-03-20 DIAGNOSIS — E669 Obesity, unspecified: Secondary | ICD-10-CM

## 2021-03-20 DIAGNOSIS — Z3A41 41 weeks gestation of pregnancy: Secondary | ICD-10-CM

## 2021-03-20 DIAGNOSIS — O480 Post-term pregnancy: Principal | ICD-10-CM

## 2021-03-20 DIAGNOSIS — O99213 Obesity complicating pregnancy, third trimester: Secondary | ICD-10-CM

## 2021-03-20 LAB — CBC
HCT: 37 % (ref 36.0–46.0)
Hemoglobin: 12.7 g/dL (ref 12.0–15.0)
MCH: 33.5 pg (ref 26.0–34.0)
MCHC: 34.3 g/dL (ref 30.0–36.0)
MCV: 97.6 fL (ref 80.0–100.0)
Platelets: 250 10*3/uL (ref 150–400)
RBC: 3.79 MIL/uL — ABNORMAL LOW (ref 3.87–5.11)
RDW: 12.4 % (ref 11.5–15.5)
WBC: 17.1 10*3/uL — ABNORMAL HIGH (ref 4.0–10.5)
nRBC: 0 % (ref 0.0–0.2)

## 2021-03-20 IMAGING — US US MFM OB DETAIL+14 WK
1 series · 14 of 28 positions shown · non-contrast
Comparison: none

[Series 1: us mfm ob detail+14 wk · 14 of 53 slices shown]
[im 2/53]
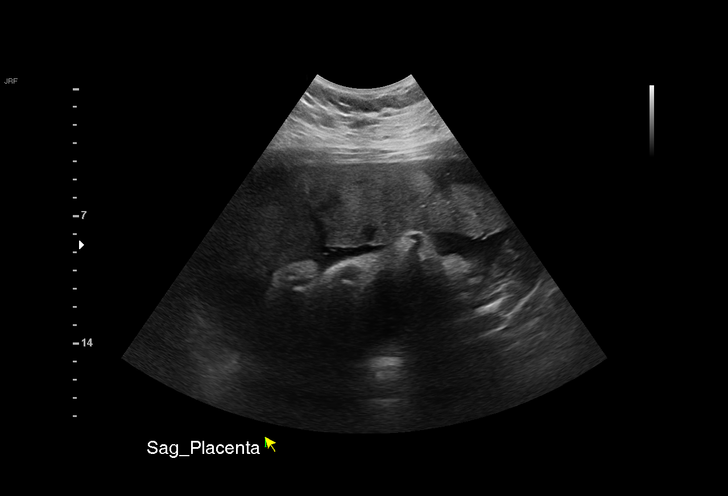
[im 6/53]
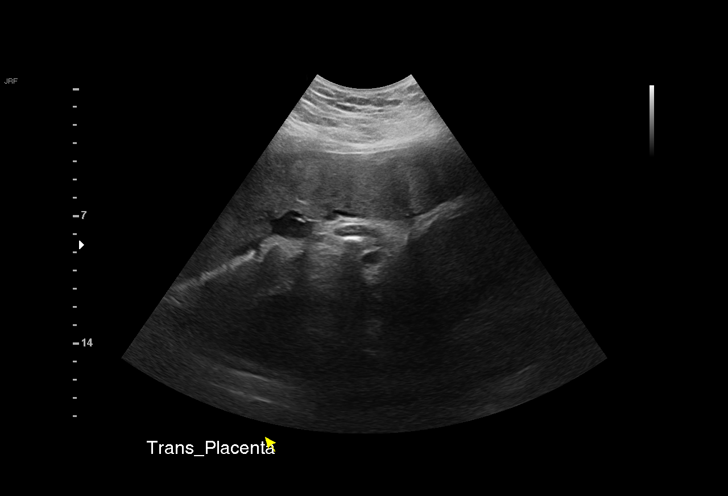
[im 10/53]
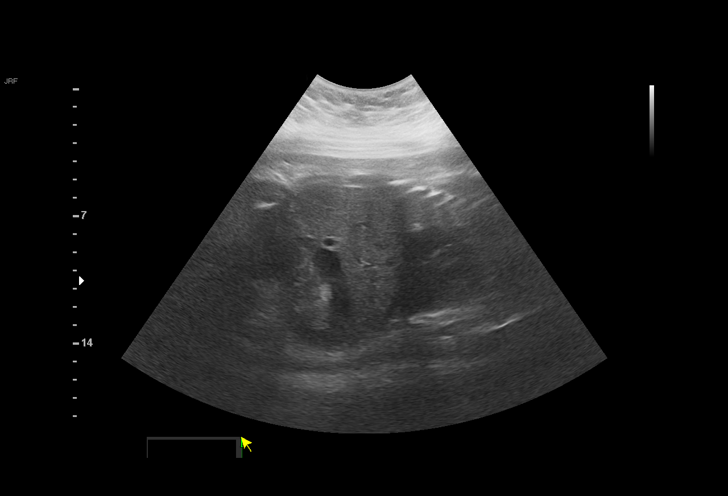
[im 14/53]
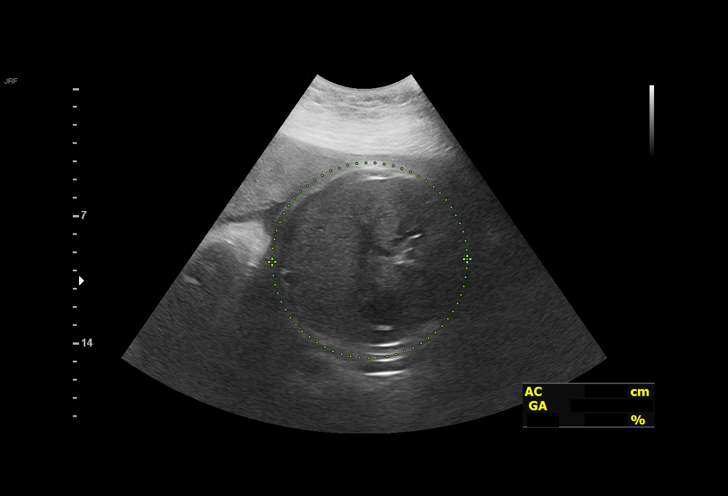
[im 18/53]
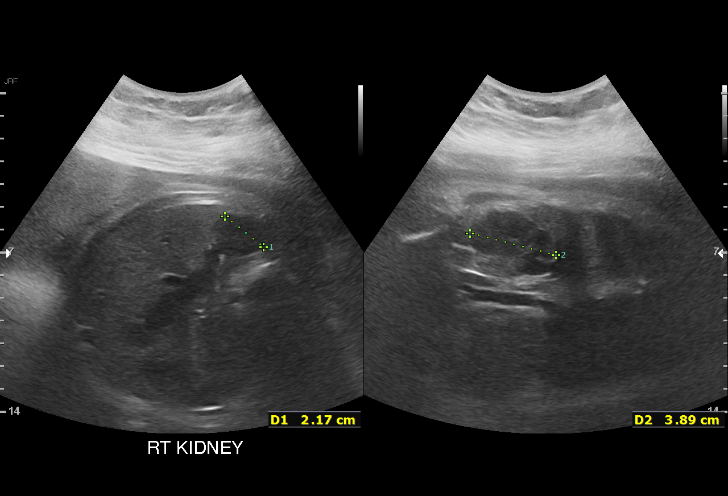
[im 22/53]
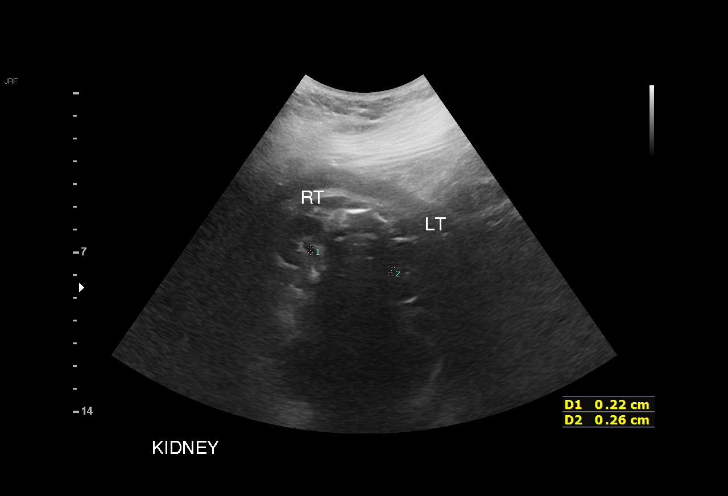
[im 26/53]
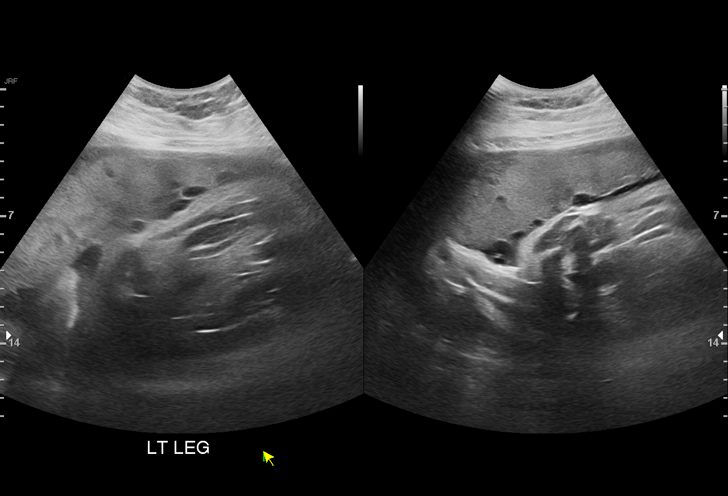
[im 29/53]
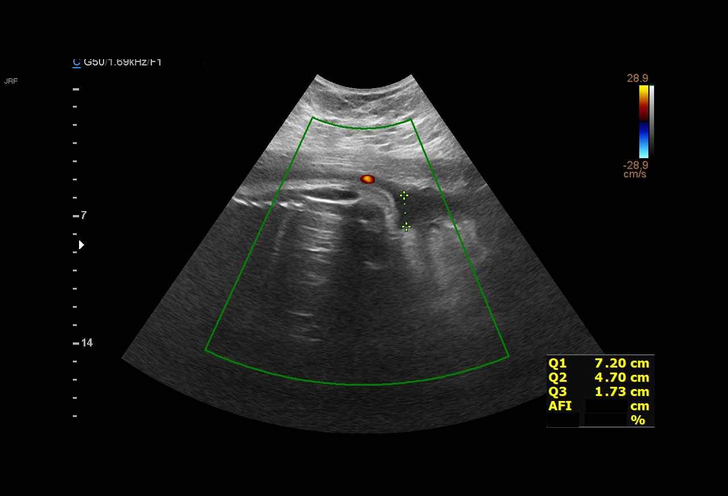
[im 33/53]
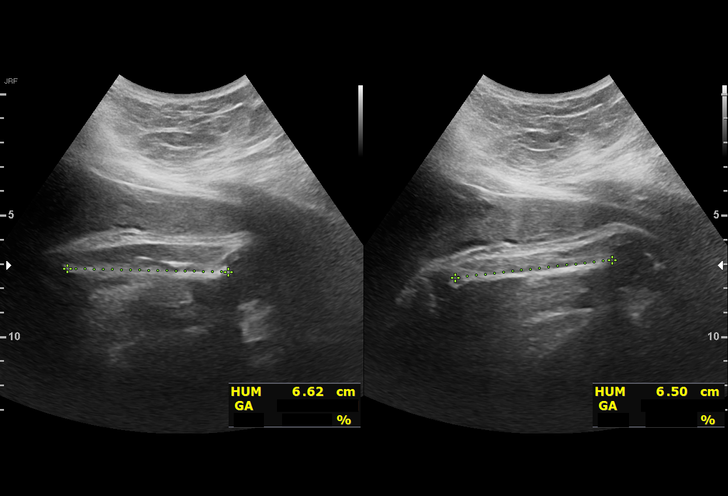
[im 37/53]
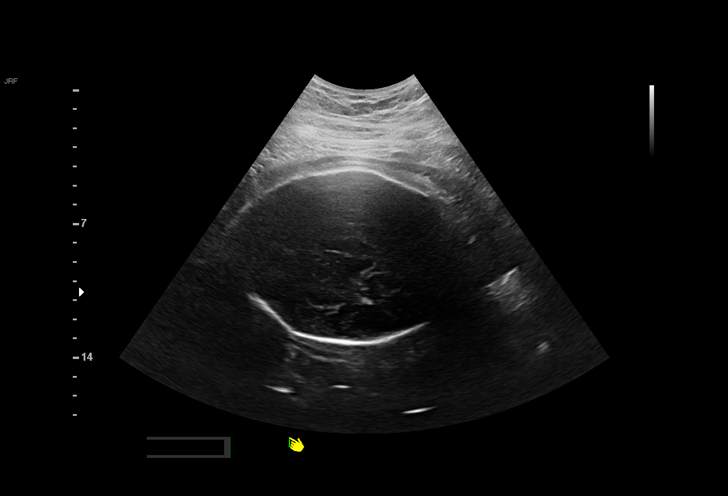
[im 41/53]
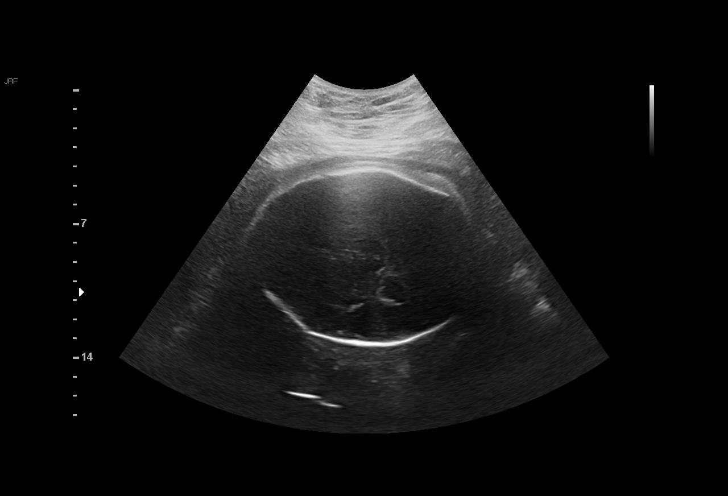
[im 45/53]
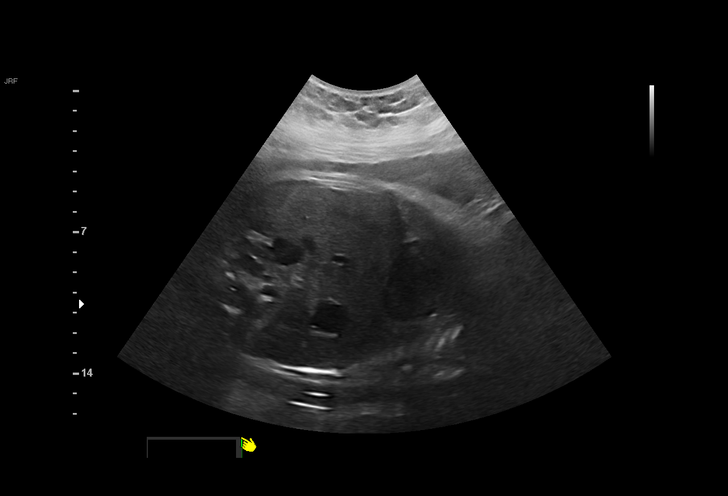
[im 49/53]
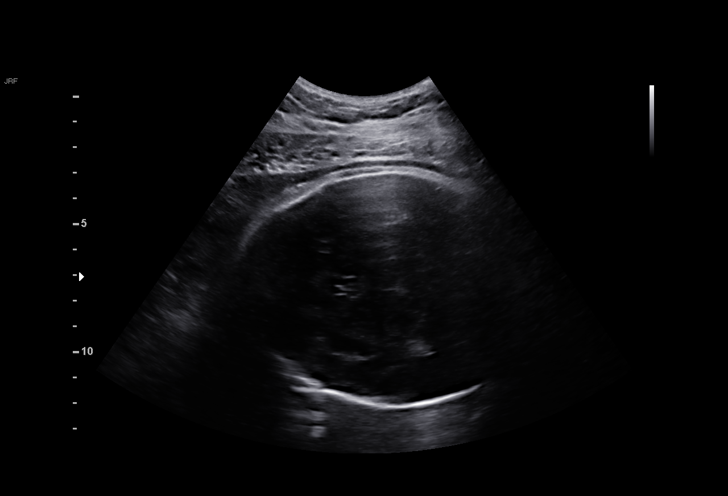
[im 53/53]
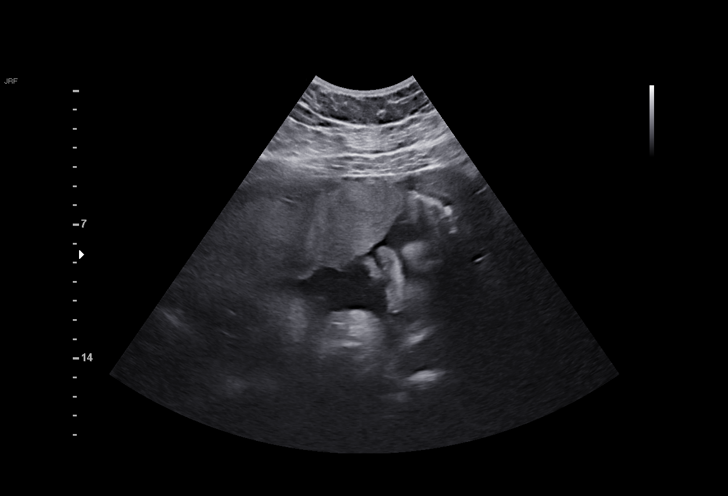

[14 of 28 positions shown; findings below may reference images not displayed]

Indications

 Obesity complicating pregnancy, third trimester
 36 weeks gestation of pregnancy
Fetal Evaluation

 Num Of Fetuses:          1
 Fetal Heart Rate(bpm):   133
 Cardiac Activity:        Observed
 Presentation:            Cephalic
 Placenta:                Anterior
 P. Cord Insertion:       Visualized, central

 AFI Sum(cm)     %Tile       Largest Pocket(cm)
 13.6            50

 RUQ(cm)       RLQ(cm)        LUQ(cm)        LLQ(cm)
 7.2           0
Biometry

 BPD:      90.1   mm     G. Age:  36w 3d         57  %    CI:          74.4  %    70 - 86
                                                          FL/HC:       21.4  %    20.8 -
 HC:      331.6   mm     G. Age:  37w 6d         49  %    HC/AC:       0.97       0.92 -
 AC:      340.6   mm     G. Age:  38w 0d         90  %    FL/BPD:      78.8  %    71 - 87
 FL:         71   mm     G. Age:  36w 3d         39  %    FL/AC:       20.8  %    20 - 24
 HUM:      65.4   mm     G. Age:  38w 0d         91  %
 LV:         2.2  mm
 Est. FW:    5455   gm      7 lb 1 oz     73  %
OB History

 Gravidity:     1
Gestational Age

 LMP:            35w 3d       Date:  06/11/20                   EDD:  03/18/21
 U/S Today:      37w 1d                                         EDD:  03/06/21
 Best:           36w 5d    Det. By:  Early Ultrasound           EDD:  03/09/21
                                     (08/28/20)
Anatomy

 Cranium:                Appears normal         Aortic Arch:            Not well visualized
 Cavum:                  Appears normal         Ductal Arch:            Not well visualized
 Ventricles:             Appears normal         Diaphragm:              Appears normal
 Choroid Plexus:         Appears normal         Stomach:                Appears normal, left
                                                                        sided
 Cerebellum:             Not well visualized    Abdomen:                Not well visualized
 Posterior Fossa:        Not well visualized    Abdominal Wall:         Not well visualized
 Nuchal Fold:            Not applicable (>20    Cord Vessels:           Appears normal (3
                         wks GA)                                        vessel cord)
 Face:                   Not well visualized    Kidneys:                Appear normal
 Lips:                   Appears normal         Bladder:                Appears normal
 Thoracic:               Appears normal         Spine:                  Not well visualized
 Heart:                  Appears normal         Upper Extremities:      Appears normal
                         (4CH, axis, and situs)
 RVOT:                   Not well visualized    Lower Extremities:      Appears normal
 LVOT:                   Not well visualized

 Other:   Technicallly difficult due to advanced GA and maternal habitus.
Comments

 This patient was seen for an ultrasound exam today due to
 maternal obesity.  She denies any problems in her current
 pregnancy and reports that she has screened negative for
 gestational diabetes.
 She was informed that the fetal growth and amniotic fluid level
 appears appropriate for her gestational age.
 The views of the fetal anatomy were limited today due to her
 advanced gestational age.
 Follow-up as indicated.

## 2021-03-20 MED ORDER — NALBUPHINE HCL 10 MG/ML IJ SOLN: 5.0000 mg | Freq: Once | INTRAMUSCULAR | Status: DC | PRN

## 2021-03-20 MED ORDER — KETOROLAC TROMETHAMINE 30 MG/ML IJ SOLN
30.0000 mg | Freq: Four times a day (QID) | INTRAMUSCULAR | Status: DC | PRN
  Administered 2021-03-20: 30 mg via INTRAVENOUS
  Filled 2021-03-20: qty 1

## 2021-03-20 MED ORDER — SENNOSIDES-DOCUSATE SODIUM 8.6-50 MG PO TABS
2.0000 | ORAL_TABLET | ORAL | Status: DC
  Administered 2021-03-21 – 2021-03-22 (×2): 2 via ORAL
  Filled 2021-03-20 (×2): qty 2

## 2021-03-20 MED ORDER — NALBUPHINE HCL 10 MG/ML IJ SOLN
5.0000 mg | Freq: Once | INTRAMUSCULAR | Status: DC | PRN
Start: 2021-03-20 — End: 2021-03-20

## 2021-03-20 MED ORDER — ONDANSETRON HCL 4 MG/2ML IJ SOLN
4.0000 mg | Freq: Three times a day (TID) | INTRAMUSCULAR | Status: DC | PRN
  Administered 2021-03-20: 4 mg via INTRAVENOUS
  Filled 2021-03-20: qty 2

## 2021-03-20 MED ORDER — NALBUPHINE HCL 10 MG/ML IJ SOLN: 5.0000 mg | INTRAMUSCULAR | Status: DC | PRN

## 2021-03-20 MED ORDER — IBUPROFEN 800 MG PO TABS: 800.0000 mg | ORAL_TABLET | Freq: Three times a day (TID) | ORAL | Status: DC

## 2021-03-20 MED ORDER — IBUPROFEN 800 MG PO TABS
800.0000 mg | ORAL_TABLET | Freq: Three times a day (TID) | ORAL | Status: DC
  Filled 2021-03-20: qty 1

## 2021-03-20 MED ORDER — DIPHENHYDRAMINE HCL 50 MG/ML IJ SOLN: 12.5000 mg | INTRAMUSCULAR | Status: DC | PRN

## 2021-03-20 MED ORDER — SODIUM CHLORIDE 0.9% FLUSH: 3.0000 mL | INTRAVENOUS | Status: DC | PRN

## 2021-03-20 MED ORDER — NALOXONE HCL 4 MG/10ML IJ SOLN
1.0000 ug/kg/h | INTRAVENOUS | Status: DC | PRN
  Filled 2021-03-20: qty 5

## 2021-03-20 MED ORDER — SIMETHICONE 80 MG PO CHEW
80.0000 mg | CHEWABLE_TABLET | Freq: Three times a day (TID) | ORAL | Status: DC
  Administered 2021-03-20 – 2021-03-22 (×7): 80 mg via ORAL
  Filled 2021-03-20 (×7): qty 1

## 2021-03-20 MED ORDER — OXYTOCIN-SODIUM CHLORIDE 30-0.9 UT/500ML-% IV SOLN
2.5000 [IU]/h | INTRAVENOUS | Status: DC
  Administered 2021-03-20 (×2): 2.5 [IU]/h via INTRAVENOUS
  Filled 2021-03-20: qty 500

## 2021-03-20 MED ORDER — MENTHOL 3 MG MT LOZG
1.0000 | LOZENGE | OROMUCOSAL | Status: DC | PRN
  Filled 2021-03-20: qty 9

## 2021-03-20 MED ORDER — DIPHENHYDRAMINE HCL 25 MG PO CAPS: 25.0000 mg | ORAL_CAPSULE | ORAL | Status: DC | PRN

## 2021-03-20 MED ORDER — SIMETHICONE 80 MG PO CHEW
80.0000 mg | CHEWABLE_TABLET | ORAL | Status: DC | PRN
Start: 2021-03-20 — End: 2021-03-22

## 2021-03-20 MED ORDER — OXYCODONE-ACETAMINOPHEN 5-325 MG PO TABS: 1.0000 | ORAL_TABLET | ORAL | Status: DC | PRN

## 2021-03-20 MED ORDER — DIPHENHYDRAMINE HCL 25 MG PO CAPS: 25.0000 mg | ORAL_CAPSULE | Freq: Four times a day (QID) | ORAL | Status: DC | PRN

## 2021-03-20 MED ORDER — FENTANYL CITRATE (PF) 100 MCG/2ML IJ SOLN: 25.0000 ug | INTRAMUSCULAR | Status: DC | PRN

## 2021-03-20 MED ORDER — KETOROLAC TROMETHAMINE 30 MG/ML IJ SOLN: 30.0000 mg | Freq: Four times a day (QID) | INTRAMUSCULAR | Status: DC | PRN

## 2021-03-20 MED ORDER — ONDANSETRON HCL 4 MG/2ML IJ SOLN: 4.0000 mg | Freq: Once | INTRAMUSCULAR | Status: DC | PRN

## 2021-03-20 MED ORDER — PRENATAL MULTIVITAMIN CH
1.0000 | ORAL_TABLET | Freq: Every day | ORAL | Status: DC
  Administered 2021-03-20 – 2021-03-21 (×2): 1 via ORAL
  Filled 2021-03-20 (×2): qty 1

## 2021-03-20 MED ORDER — DIBUCAINE (PERIANAL) 1 % EX OINT: 1.0000 "application " | TOPICAL_OINTMENT | CUTANEOUS | Status: DC | PRN

## 2021-03-20 MED ORDER — OXYCODONE HCL 5 MG PO TABS
5.0000 mg | ORAL_TABLET | ORAL | Status: DC | PRN
  Administered 2021-03-22: 5 mg via ORAL
  Filled 2021-03-20: qty 1

## 2021-03-20 MED ORDER — ENOXAPARIN SODIUM 40 MG/0.4ML ~~LOC~~ SOLN
40.0000 mg | SUBCUTANEOUS | Status: DC
  Administered 2021-03-21 – 2021-03-22 (×2): 40 mg via SUBCUTANEOUS
  Filled 2021-03-20 (×2): qty 0.4

## 2021-03-20 MED ORDER — SENNOSIDES-DOCUSATE SODIUM 8.6-50 MG PO TABS
2.0000 | ORAL_TABLET | ORAL | Status: DC
  Administered 2021-03-20: 2 via ORAL
  Filled 2021-03-20 (×2): qty 2

## 2021-03-20 MED ORDER — COCONUT OIL OIL
1.0000 "application " | TOPICAL_OIL | Status: DC | PRN
  Administered 2021-03-21: 1 via TOPICAL
  Filled 2021-03-20: qty 120

## 2021-03-20 MED ORDER — LACTATED RINGERS IV SOLN: INTRAVENOUS | Status: DC

## 2021-03-20 MED ORDER — IBUPROFEN 800 MG PO TABS
800.0000 mg | ORAL_TABLET | Freq: Three times a day (TID) | ORAL | Status: DC
  Administered 2021-03-20 – 2021-03-22 (×5): 800 mg via ORAL
  Filled 2021-03-20 (×4): qty 1

## 2021-03-20 MED ORDER — WITCH HAZEL-GLYCERIN EX PADS: 1.0000 "application " | MEDICATED_PAD | CUTANEOUS | Status: DC | PRN

## 2021-03-20 MED ORDER — MEPERIDINE HCL 25 MG/ML IJ SOLN
6.2500 mg | INTRAMUSCULAR | Status: DC | PRN
Start: 2021-03-20 — End: 2021-03-20

## 2021-03-20 MED ORDER — NALOXONE HCL 0.4 MG/ML IJ SOLN: 0.4000 mg | INTRAMUSCULAR | Status: DC | PRN

## 2021-03-20 MED ORDER — ZOLPIDEM TARTRATE 5 MG PO TABS: 5.0000 mg | ORAL_TABLET | Freq: Every evening | ORAL | Status: DC | PRN

## 2021-03-20 NOTE — Anesthesia Postprocedure Evaluation (Signed)
Anesthesia Post Note  Patient: Collier Monica  Procedure(s) Performed: CESAREAN SECTION  Patient location during evaluation: Mother Baby Anesthesia Type: Spinal Level of consciousness: oriented and awake and alert Pain management: pain level controlled Vital Signs Assessment: post-procedure vital signs reviewed and stable Respiratory status: spontaneous breathing and respiratory function stable Cardiovascular status: blood pressure returned to baseline and stable Postop Assessment: no headache, no backache, no apparent nausea or vomiting and able to ambulate Anesthetic complications: no   No complications documented.   Last Vitals:  Vitals:   03/20/21 0417 03/20/21 0730  BP: 120/61 126/67  Pulse: 74   Resp: 18 18  Temp: 37.1 C 36.6 C  SpO2: 97%     Last Pain:  Vitals:   03/20/21 0730  TempSrc: Oral  PainSc: 3                  Starling Manns

## 2021-03-20 NOTE — Op Note (Addendum)
Preoperative Diagnosis: 1) 35 y.o. G1P0 at [redacted]w[redacted]d with arrest of dilation 2) Obesity Body mass index is 48.29 kg/m.  Postoperative Diagnosis: 1) 35 y.o. G1P001 at [redacted]w[redacted]d with arrest of dilation 2) Obesity Body mass index is 48.29 kg/m. 3) Meconium  Operation Performed: Primary low transverse C-section via pfannenstiel skin incision  Indication: Arrest of dilation at 5cm  Anesthesia: .Epidural   Primary Surgeon: Vena Austria, MD  Assistant: Thomasene Mohair, MD this surgery required a high level surgical assistant with none other readily available  Preoperative Antibiotics: 3g Ancef  Estimated Blood Loss: 590 mL  IV Fluids:  Drains or Tubes: Foley to gravity drainage, ON-Q catheter system  Implants: none  Specimens Removed: none  Complications: none  Intraoperative Findings:  Normal tubes ovaries and uterus.  Delivery resulted in the birth of a liveborn female, APGAR (1 MIN): 8, APGAR (5 MINS): 9    Patient Condition: stable  Procedure in Detail:  Patient was taken to the operating room were she was administered regional anesthesia.  She was positioned in the supine position, prepped and draped in the  Usual sterile fashion.  Prior to proceeding with the case a time out was performed and the level of anesthetic was checked and noted to be adequate.  Utilizing the scalpel a pfannenstiel skin incision was made 2cm above the pubic symphysis and carried down sharply to the the level of the rectus fascia.  The fascia was extended using manual traction.  The midline was identified, the peritoneum was entered bluntly and expanded using manual tractions.  The uterus was noted to be in a none rotated position.  Next the bladder blade was placed retracting the bladder caudally.  A bladder flap was not created.  A low transverse incision was scored on the lower uterine segment.  The hysterotomy was entered bluntly using the operators finger.  The hysterotomy incision was extended  using manual traction.  The operators hand was placed within the hysterotomy position noting the fetus to be within the OA position.  The vertex was grasped, flexed, brought to the incision, and delivered a traumatically using fundal pressure provided by Dr. Jean Rosenthal.  The remainder of the body delivered with ease.  The infant was suctioned, cord was clamped and cut before handing off to the awaiting neonatologist.  The placenta was delivered using manual extraction.  The uterus was exteriorized, wiped clean of clots and debris using two moist laps.  The hysterotomy was closed using a two layer closure of 0 Vicryl, with the first being a running locked, the second a vertical imbricating.  The uterus was returned to the abdomen.  The peritoneal gutters were wiped clean of clots and debris using two moist laps.  The hysterotomy incision was re-inspected noted to be hemostatic.  The peritoneum was closed using a running 2-0 Vicryl stitch.  The rectus muscles were inspected noted to be hemostatic.  The superior border of the rectus fascia was grasped with a Kocher clamp.  The ON-Q trocars were then placed 4cm above the superior border of the incision and tunneled subfascially.  The introducers were removed and the catheters were threaded through the sleeves after which the sleeves were removed.  The fascia was closed using a looped #1 PDS in a running fashion taking 1cm by 1cm bites.  The subcutaneous tissue was irrigated using warm saline, hemostasis achieved using the bovie.  The subcutaneous dead space was greater than 3cm and was closed.  The subcutaneous dead space was obliterated  by using a 53-T 0 Chromic in a running fashion.  The skin was closed using Insorb staples. The ON-Q catheter did not flush and were removed intact. Sponge needle and instrument counts were corrects times two.  The patient tolerated the procedure well and was taken to the recovery room in stable condition.

## 2021-03-20 NOTE — Transfer of Care (Signed)
Immediate Anesthesia Transfer of Care Note  Patient: Morgan Grant  Procedure(s) Performed: CESAREAN SECTION  Patient Location: PACU  Anesthesia Type:Spinal  Level of Consciousness: awake, alert , oriented and patient cooperative  Airway & Oxygen Therapy: Patient Spontanous Breathing  Post-op Assessment: Report given to RN and Post -op Vital signs reviewed and stable  Post vital signs: Reviewed and stable  Last Vitals:  Vitals Value Taken Time  BP    Temp 36.9 C 03/20/21 0037  Pulse    Resp    SpO2      Last Pain:  Vitals:   03/20/21 0039  TempSrc:   PainSc: 0-No pain         Complications: No complications documented.

## 2021-03-20 NOTE — Lactation Note (Signed)
This note was copied from a baby's chart. Lactation Consultation Note  Patient Name: Girl Marilea Gwynne [id number] Date: 03/20/2021 Reason for consult: Initial assessment;1st time breastfeeding;Term;Other (Comment) (c-section) Age:35 hours  Initial lactation visit. G1P1 delivered via c-section 14hrs ago. LGA baby has had stable blood sugars, and is breastfeeding.   Baby just received a bath, parents attempting a feeding. LC assisted with latching baby. Baby grasped breast easily, has strong rhythmic sucking pattern with swallows identifiable.   LC educated parents and support person on newborn stomach size, feeding patterns and behaviors in first 24 hours, feeding with cues/early hunger cues, benefits of skin to skin, and output expectations for day 1.  LC taught/demonstrated hand expression where colostrum was visible, and encouraged hand expression if baby is sleepy for practice and stimulation.  Whiteboard updated, encouraged to call with questions/concerns or for breastfeeding support.  Maternal Data Has patient been taught Hand Expression?: Yes Does the patient have breastfeeding experience prior to this delivery?: No  Feeding Mother's Current Feeding Choice: Breast Milk  LATCH Score Latch: Grasps breast easily, tongue down, lips flanged, rhythmical sucking.  Audible Swallowing: A few with stimulation  Type of Nipple: Everted at rest and after stimulation  Comfort (Breast/Nipple): Soft / non-tender  Hold (Positioning): Assistance needed to correctly position infant at breast and maintain latch.  LATCH Score: 8   Lactation Tools Discussed/Used    Interventions Interventions: Breast feeding basics reviewed;Assisted with latch;Skin to skin;Hand express;Breast massage;Breast compression;Support pillows;Education  Discharge    Consult Status Consult Status: Follow-up Date: 03/21/21 Follow-up type: In-patient    Danford Bad 03/20/2021, 2:17 PM

## 2021-03-20 NOTE — Progress Notes (Signed)
Subjective: Postpartum Day 1: Cesarean Delivery Patient reports tolerating PO and no problems voiding.  She is sitting up in bed comfortably. She denies any heavy vaginal bleeding, her pain is wel controlled with IV meds, and she is voiding adequately. Learning breastfeeding skills. Objective: Vital signs in last 24 hours: Temp:  [97.8 F (36.6 C)-98.8 F (37.1 C)] 97.8 F (36.6 C) (04/06 0730) Pulse Rate:  [65-87] 74 (04/06 0417) Resp:  [9-26] 18 (04/06 0730) BP: (90-138)/(26-84) 126/67 (04/06 0730) SpO2:  [92 %-100 %] 97 % (04/06 0417)  Physical Exam:  General: alert, cooperative, no distress and morbidly obese Lochia: appropriate Uterine Fundus: firm Incision: healing well, no significant drainage, honey comb dressing is place without drainage. DVT Evaluation: No evidence of DVT seen on physical exam. Negative Homan's sign. No cords or calf tenderness.  Recent Labs    03/19/21 0543 03/20/21 0540  HGB 14.8 12.7  HCT 42.7 37.0    Assessment/Plan: Status post Cesarean section. Doing well postoperatively.  Continue current care. Additional time spent assisting with correct positioning and latch for breastfeeding in the football hold. Suggest additional lactational support today.  Mirna Mires 03/20/2021, 10:24 AM

## 2021-03-20 NOTE — Anesthesia Post-op Follow-up Note (Signed)
  Anesthesia Pain Follow-up Note  Patient: Morgan Grant  Day #: 1  Date of Follow-up: 03/20/2021 Time: 9:00 AM  Last Vitals:  Vitals:   03/20/21 0417 03/20/21 0730  BP: 120/61 126/67  Pulse: 74   Resp: 18 18  Temp: 37.1 C 36.6 C  SpO2: 97%     Level of Consciousness: alert  Pain: none   Side Effects:None  Catheter Site Exam:clean, dry, no drainage  Anti-Coag Meds (From admission, onward)   Start     Dose/Rate Route Frequency Ordered Stop   03/21/21 0800  enoxaparin (LOVENOX) injection 40 mg        40 mg Subcutaneous Every 24 hours 03/20/21 0324         Plan: D/C from anesthesia care at surgeon's request  Starling Manns

## 2021-03-21 MED ORDER — ACETAMINOPHEN 325 MG PO TABS
650.0000 mg | ORAL_TABLET | ORAL | Status: DC | PRN
  Administered 2021-03-21 – 2021-03-22 (×5): 650 mg via ORAL
  Filled 2021-03-21 (×5): qty 2

## 2021-03-21 MED ORDER — MEASLES, MUMPS & RUBELLA VAC IJ SOLR
0.5000 mL | Freq: Once | INTRAMUSCULAR | Status: AC
  Administered 2021-03-22: 0.5 mL via SUBCUTANEOUS
  Filled 2021-03-21 (×2): qty 0.5

## 2021-03-21 NOTE — Progress Notes (Signed)
Obstetric Postpartum/PostOperative Daily Progress Note Subjective:  35 y.o. G1P1001 post-operative day # 2 status post primary cesarean delivery.  She is ambulating, is tolerating po, is voiding spontaneously.  Her pain is well controlled on PO pain medications and On Q pump. Her lochia is less than menses. She reports breastfeeding is generally going well with support from lactation- better on left than right. She may want to go home later if all is well.    Medications SCHEDULED MEDICATIONS  . enoxaparin (LOVENOX) injection  40 mg Subcutaneous Q24H  . ibuprofen  800 mg Oral Q8H  . prenatal multivitamin  1 tablet Oral Q1200  . senna-docusate  2 tablet Oral Q24H  . simethicone  80 mg Oral TID PC    MEDICATION INFUSIONS    PRN MEDICATIONS  coconut oil, witch hazel-glycerin **AND** dibucaine, diphenhydrAMINE, menthol-cetylpyridinium, ondansetron (ZOFRAN) IV, oxyCODONE, oxyCODONE-acetaminophen, simethicone, zolpidem    Objective:   Vitals:   03/20/21 1900 03/20/21 2100 03/20/21 2210 03/21/21 0749  BP:   119/66 131/75  Pulse: 90  75 77  Resp:   18 17  Temp:   98.4 F (36.9 C) 97.7 F (36.5 C)  TempSrc:   Axillary Oral  SpO2: 95% 96% 100% 99%  Weight:      Height:        Current Vital Signs 24h Vital Sign Ranges  T 97.7 F (36.5 C) Temp  Avg: 98.1 F (36.7 C)  Min: 97.6 F (36.4 C)  Max: 98.8 F (37.1 C)  BP 131/75 BP  Min: 113/77  Max: 131/75  HR 77 Pulse  Avg: 78.4  Min: 72  Max: 90  RR 17 Resp  Avg: 16.8  Min: 16  Max: 18  SaO2 99 %   SpO2  Avg: 97.4 %  Min: 95 %  Max: 100 %       24 Hour I/O Current Shift I/O  Time Ins Outs 04/06 0701 - 04/07 0700 In: 1041.5 [P.O.:40; I.V.:1001.5] Out: 3200 [Urine:3200] No intake/output data recorded.  General: NAD Pulmonary: no increased work of breathing Abdomen: non-distended, non-tender, fundus firm at level of umbilicus Inc: Clean/dry/intact, On Q intact Extremities: no edema, no erythema, no tenderness  Labs:  Recent Labs   Lab 03/19/21 0543 03/20/21 0540  WBC 10.5 17.1*  HGB 14.8 12.7  HCT 42.7 37.0  PLT 292 250     Assessment:   35 y.o. G1P1001 postoperative day # 2 status post primary cesarean section, lactating   Plan:  1) Acute blood loss anemia - hemodynamically stable and asymptomatic - po ferrous sulfate  2) O POS Performed at [facility], [address] [place], [place] [address]  / Ishmael Holter <0.90 (09/14 1029)/ Varicella Immune  3) TDAP status given antepartum  4) Breastfeeding: lactation support today  5) Disposition: continue current care with possible discharge later today pending progress   Tresea Mall, [facility] 03/21/2021 10:22 AM

## 2021-03-21 NOTE — Lactation Note (Signed)
This note was copied from a baby's chart. Lactation Consultation Note  Patient Name: Girl Ravenne Wayment [id number] Date: 03/21/2021   Age:35 HOL Lactation to the room for follow up visit. Mother is holding the baby on the right in cross cradle. Baby is latched bbut not sucking. LC taught how to relatch baby deeper onto the breast and encouraged breast compression for a rhythmic sucking pattern with swallows. Baby is sleepy at breast but does arouse with stimulation and will suckle. Mother was pleased to see colostrum when hand expressing. Encouraged a rolled cloth under Mother's breast to assist with elevating the nipple.  Encouraged feeding on demand and with cues. If baby is not cueing encouraged hand expression and skin to skin. Taught proper technique for hand expression.  Baby was left skin to skin with Mother. Encouraged 8 or more good feeds every 24 HOL. Reviewed appropriate diapers for days of life and How to know your baby is getting enough to eat. Reviewed "Understanding Postpartum and Newborn Care" INJOY booklet at bedside. [facility] # left on board, encouraged to call for any assistance. Mother has no further questions at this time.   LATCH Score Latch: Grasps breast easily, tongue down, lips flanged, rhythmical sucking.  Audible Swallowing: A few with stimulation  Type of Nipple: Everted at rest and after stimulation  Comfort (Breast/Nipple): Soft / non-tender  Hold (Positioning): Assistance needed to correctly position infant at breast and maintain latch.  LATCH Score: 8   Lactation Tools Discussed/Used    Interventions    Discharge    Consult Status      Eimi Viney D Safwan Tomei 03/21/2021, 4:12 PM

## 2021-03-22 MED ORDER — ACETAMINOPHEN 325 MG PO TABS: 650.0000 mg | ORAL_TABLET | Freq: Four times a day (QID) | ORAL | Status: DC | PRN

## 2021-03-22 MED ORDER — IBUPROFEN 800 MG PO TABS: 800.0000 mg | ORAL_TABLET | Freq: Three times a day (TID) | ORAL | 0 refills | Status: AC

## 2021-03-22 MED ORDER — OXYCODONE HCL 5 MG PO TABS: 5.0000 mg | ORAL_TABLET | Freq: Four times a day (QID) | ORAL | 0 refills | Status: DC | PRN

## 2021-03-22 NOTE — Progress Notes (Signed)
Patient discharged home with infant. Discharge instructions, prescriptions and follow up appointment given to and reviewed with patient. Patient verbalized understanding. Pt wheeled out with infant by auxiliary.  

## 2021-03-22 NOTE — Lactation Note (Signed)
This note was copied from a baby's chart. Lactation Consultation Note  Patient Name: Morgan Grant [id number] Date: 03/22/2021 Reason for consult: Follow-up assessment;Primapara;Term Age:35 hours  Maternal Data Has patient been taught Hand Expression?: Yes Does the patient have breastfeeding experience prior to this delivery?: No  Feeding Mother's Current Feeding Choice: Breast Milk Mom needing assistance with latching baby to right breast, states baby latches easier to left, baby very fussy at breast, pushing away and crying, cradle hold tried but baby having trouble coordinating latch and suck, will suck on finger but can't transition to breast, pushes away when offered breast, placed in modified football hold and mom shown how to support breast and compress tissue to get deep latch, baby calmed and then was eventually able to coordinate and sustain latch and is nursing welll with occ. Swallow noted, mom states she has noticed increasing colostrum and thinner consistency of colostrum     LATCH Score Latch: Repeated attempts needed to sustain latch, nipple held in mouth throughout feeding, stimulation needed to elicit sucking reflex.  Audible Swallowing: A few with stimulation  Type of Nipple: Everted at rest and after stimulation  Comfort (Breast/Nipple): Soft / non-tender  Hold (Positioning): Assistance needed to correctly position infant at breast and maintain latch.  LATCH Score: 7   Lactation Tools Discussed/Used  encouraged frequent feeds to increase milk supply, LC no written on white board  Interventions Interventions: Assisted with latch;Skin to skin;Breast massage;Hand express;Breast compression;Adjust position;Education  Discharge Pump: Personal WIC Program: No  Consult Status Consult Status: PRN Date: 03/22/21 Follow-up type: In-patient    Dyann Kief 03/22/2021, 10:59 AM

## 2021-03-22 NOTE — Discharge Summary (Signed)
OB Discharge Summary     Patient Name: Morgan Grant DOB: 1986/07/25 MRN: [id number]  Date of admission: 03/19/2021 Delivering MD: Vena Austria, MD Date of Delivery: 03/19/2021  Date of discharge: 03/22/2021  Admitting diagnosis: Post-dates pregnancy [O48.0] Intrauterine pregnancy: [redacted]w[redacted]d     Secondary diagnosis: None     Discharge diagnosis: Failure to progress in labor, Cesarean delivery, delivered                                                                                                Post partum procedures: None  Augmentation: AROM and Pitocin  Complications: None  Hospital course:  Induction of Labor With Cesarean Section   35 y.o. yo G1P1001 at [redacted]w[redacted]d was admitted to the hospital 03/19/2021 for induction of labor. Patient had a labor course significant for failure to progress. The patient went for cesarean section due to Arrest of Dilation. Delivery details are as follows: Membrane Rupture Time/Date: 9:15 AM ,03/19/2021   Delivery Method:C-Section, Low Transverse  Details of operation can be found in separate operative Note.  Patient had an uncomplicated postpartum course. She is ambulating, tolerating a regular diet, passing flatus, and urinating well.  Patient is discharged home in stable condition on 03/22/21.      Newborn Data: Birth date:03/19/2021  Birth time:11:28 PM  Gender:Female  Living status:Living  Apgars:8 ,9  Weight:4390 g                                 Physical exam  Vitals:   03/21/21 0749 03/21/21 1549 03/21/21 2310 03/22/21 0737  BP: 131/75 129/70 125/72 127/87  Pulse: 77 77 74 67  Resp: 17 18 20 20   Temp: 97.7 F (36.5 C) 97.8 F (36.6 C) 98.5 F (36.9 C) 97.8 F (36.6 C)  TempSrc: Oral Oral Oral Oral  SpO2: 99%  96% 100%  Weight:      Height:       General: alert, cooperative and no distress Lochia: appropriate Uterine Fundus: firm Incision: Healing well with no significant drainage, Dressing is clean, dry, and intact DVT  Evaluation: No evidence of DVT seen on physical exam. No significant calf/ankle edema.  Labs: Lab Results  Component Value Date   WBC 17.1 (H) 03/20/2021   HGB 12.7 03/20/2021   HCT 37.0 03/20/2021   MCV 97.6 03/20/2021   PLT 250 03/20/2021    Discharge instruction: per After Visit Summary.  Medications:  Allergies as of 03/22/2021   No Known Allergies     Medication List    STOP taking these medications   VITAMIN K2-VITAMIN D3 PO     TAKE these medications   acetaminophen 325 MG tablet Commonly known as: TYLENOL Take 2 tablets (650 mg total) by mouth every 6 (six) hours as needed for fever or headache.   ibuprofen 800 MG tablet Commonly known as: ADVIL Take 1 tablet (800 mg total) by mouth every 8 (eight) hours.   OMEGA 3 500 PO Take by mouth. Pt unsure of dose but takes 1 per day  oxyCODONE 5 MG immediate release tablet Commonly known as: Oxy IR/ROXICODONE Take 1 tablet (5 mg total) by mouth every 6 (six) hours as needed for moderate pain or severe pain.   prenatal multivitamin Tabs tablet Take 1 tablet by mouth daily at 12 noon.       Diet: routine diet  Activity: Advance as tolerated. Pelvic rest for 6 weeks.   Outpatient follow up:  Follow-up Information    Vena Austria, MD In 1 week.   Specialty: Obstetrics and Gynecology Why: For wound re-check Contact information: [address] [place] [address] [phone]                 Postpartum contraception: Progesterone only pills Rhogam Given postpartum: n/a Rubella vaccine given postpartum: yes Varicella vaccine given postpartum: no TDaP given antepartum or postpartum: 01/10/21 Baby Feeding: Breast  Disposition:home with mother  SIGNED:  Zipporah Plants, CNM 03/22/2021 8:53 AM

## 2021-04-04 ENCOUNTER — Encounter: Payer: Self-pay | Admitting: Obstetrics and Gynecology

## 2021-04-04 ENCOUNTER — Other Ambulatory Visit: Payer: Self-pay

## 2021-04-04 ENCOUNTER — Ambulatory Visit (INDEPENDENT_AMBULATORY_CARE_PROVIDER_SITE_OTHER): Payer: 59 | Admitting: Obstetrics and Gynecology

## 2021-04-04 VITALS — BP 135/81 | Ht 68.0 in | Wt 321.0 lb

## 2021-04-04 DIAGNOSIS — Z09 Encounter for follow-up examination after completed treatment for conditions other than malignant neoplasm: Secondary | ICD-10-CM

## 2021-04-04 NOTE — Progress Notes (Signed)
   Postoperative Follow-up Patient presents post op from cesarean section  15 days ago.  Subjective: She denies fever, chills, nausea and vomiting. Eating a regular diet without difficulty. The patient is not having any pain.  Activity: increasing slowly. She denies issues with her incision.    Objective: BP 135/81   Ht 5\' 8"  (1.727 m)   Wt (!) 321 lb (145.6 kg)   BMI 48.81 kg/m   Physical Exam Constitutional:      General: She is not in acute distress.    Appearance: Normal appearance. She is well-developed.  HENT:     Head: Normocephalic and atraumatic.  Eyes:     General: No scleral icterus.    Conjunctiva/sclera: Conjunctivae normal.  Cardiovascular:     Rate and Rhythm: Normal rate and regular rhythm.     Heart sounds: No murmur heard. No friction rub. No gallop.   Pulmonary:     Effort: Pulmonary effort is normal. No respiratory distress.     Breath sounds: Normal breath sounds. No wheezing or rales.  Abdominal:     General: Bowel sounds are normal. There is no distension.     Palpations: Abdomen is soft. There is no mass.     Tenderness: There is no abdominal tenderness. There is no guarding or rebound.     Comments: Incision: without erythema, induration, warmth, and tenderness. It is clean, dry, and intact.    Musculoskeletal:        General: Normal range of motion.     Cervical back: Normal range of motion and neck supple.  Neurological:     General: No focal deficit present.     Mental Status: She is alert and oriented to person, place, and time.     Cranial Nerves: No cranial nerve deficit.  Skin:    General: Skin is warm and dry.     Findings: No erythema.  Psychiatric:        Mood and Affect: Mood normal.        Behavior: Behavior normal.        Judgment: Judgment normal.     Assessment: 35 y.o. s/p cesarean section progressing well  Plan: Patient has done well after surgery with no apparent complications.  I have discussed the post-operative  course to date, and the expected progress moving forward.  The patient understands what complications to be concerned about.    Activity plan: increasing slowly  Return in about 4 weeks (around 05/02/2021) for Six Week Postpartum with Dr. 05/04/2021.  Bonney Aid, MD 04/04/2021 12:07 PM

## 2021-04-04 NOTE — H&P (Signed)
History and Physical Interval Note:  03/19/2021 09:20 AM  Morgan Grant  has presented today for INDUCTION OF LABOR (pitocin, amniotomy),  with the diagnosis of Postdates. The various methods of treatment have been discussed with the patient and family. After consideration of risks, benefits and other options for treatment, the patient has consented to  Labor induction .  The patient's history has been reviewed, patient examined, no change in status, and is stable for induction as planned.  See H&P. I have reviewed the patient's chart and labs.  Questions were answered to the patient's satisfaction.    Zipporah Plants, CNM [facility], [facility] 03/19/2021  09:20 AM

## 2021-04-09 ENCOUNTER — Encounter: Payer: Self-pay | Admitting: Obstetrics and Gynecology

## 2021-05-02 ENCOUNTER — Encounter: Payer: Self-pay | Admitting: Obstetrics and Gynecology

## 2021-05-02 ENCOUNTER — Ambulatory Visit (INDEPENDENT_AMBULATORY_CARE_PROVIDER_SITE_OTHER): Payer: 59 | Admitting: Obstetrics and Gynecology

## 2021-05-02 ENCOUNTER — Other Ambulatory Visit (HOSPITAL_COMMUNITY)
Admission: RE | Admit: 2021-05-02 | Discharge: 2021-05-02 | Disposition: A | Discharge status: Home / Self Care | Payer: 59 | Source: Ambulatory Visit | Attending: Obstetrics and Gynecology | Admitting: Obstetrics and Gynecology

## 2021-05-02 ENCOUNTER — Other Ambulatory Visit: Payer: Self-pay

## 2021-05-02 DIAGNOSIS — Z124 Encounter for screening for malignant neoplasm of cervix: Secondary | ICD-10-CM | POA: Diagnosis present

## 2021-05-02 DIAGNOSIS — Z30011 Encounter for initial prescription of contraceptive pills: Secondary | ICD-10-CM

## 2021-05-02 MED ORDER — NORETHINDRONE 0.35 MG PO TABS: 1.0000 | ORAL_TABLET | Freq: Every day | ORAL | 3 refills | Status: DC

## 2021-05-02 NOTE — Progress Notes (Signed)
Postpartum Visit  Chief Complaint:  Chief Complaint  Patient presents with  . Post-op Follow-up    6 wk postpartum - discuss OCP. RM 3    History of Present Illness: Patient is a 35 y.o. G1P1001 presents for postpartum visit.  Date of delivery: 03/20/2021 Cesarean Section: Active phase arrest Pregnancy or labor problems:  no Any problems since the delivery:  no  Newborn Details:  SINGLETON :  1. BabyGender female. Birth weight:  63g Maternal Details:  Breast or formula feeding: plans to breastfeed Contraception after delivery: No  Any bowel or bladder issues: No  Post partum depression/anxiety noted:  no Edinburgh Post-Partum Depression Score:2 Date of last PAP: 2019  NIL and HR HPV negative   Review of Systems: Review of Systems  Constitutional: Negative.   Gastrointestinal: Negative.   Genitourinary: Negative.   Psychiatric/Behavioral: Negative.     The following portions of the patient's history were reviewed and updated as appropriate: allergies, current medications, past family history, past medical history, past social history, past surgical history and problem list.  Past Medical History:  Past Medical History:  Diagnosis Date  . 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 BRCA2 gene mutation positive 08/2020   [facility] genetic testing  . Family history of pancreatic cancer   . No known health problems     Past Surgical History:  Past Surgical History:  Procedure Laterality Date  . CESAREAN SECTION  03/19/2021   Procedure: CESAREAN SECTION;  Surgeon: Malachy Mood, MD;  Location: [facility] ORS;  Service: Obstetrics;;  . NO PAST SURGERIES      Family History:  Family History  Problem Relation Age of Onset  . Hypertension Mother   . Pancreatic cancer Father 59  . Diabetes Father   . Diabetes Maternal Grandmother   . Pancreatic cancer Maternal Grandmother 70    Social History:  Social History   Socioeconomic History  .  Marital status: Married    Spouse name: Sima Lindenberger  . Number of children: 0  . Years of education: Not on file  . Highest education level: Not on file  Occupational History  . Not on file  Tobacco Use  . Smoking status: Never Smoker  . Smokeless tobacco: Never Used  Vaping Use  . Vaping Use: Never used  Substance and Sexual Activity  . Alcohol use: Not Currently    Alcohol/week: 0.0 standard drinks    Comment: not during pregnancy  . Drug use: No  . Sexual activity: Yes    Birth control/protection: None, Pill  Other Topics Concern  . Not on file  Social History Narrative  . Not on file   Social Determinants of Health   Financial Resource Strain: Not on file  Food Insecurity: Not on file  Transportation Needs: Not on file  Physical Activity: Not on file  Stress: Not on file  Social Connections: Not on file  Intimate Partner Violence: Not on file    Allergies:  No Known Allergies  Medications: Prior to Admission medications   Medication Sig Start Date End Date Taking? Authorizing Provider  acetaminophen (TYLENOL) 325 MG tablet Take 2 tablets (650 mg total) by mouth every 6 (six) hours as needed for fever or headache. 03/22/21   Orlie Pollen, CNM  ibuprofen (ADVIL) 800 MG tablet Take 1 tablet (800 mg total) by mouth every 8 (eight) hours. 03/22/21   Orlie Pollen, CNM  Omega-3 Fatty Acids (OMEGA 3 500 PO) Take by  mouth. Pt unsure of dose but takes 1 per day    [provider]  oxyCODONE (OXY IR/ROXICODONE) 5 MG immediate release tablet Take 1 tablet (5 mg total) by mouth every 6 (six) hours as needed for moderate pain or severe pain. 03/22/21   Orlie Pollen, CNM  Prenatal Vit-Fe Fumarate-FA (PRENATAL MULTIVITAMIN) TABS tablet Take 1 tablet by mouth daily at 12 noon.    [provider]    Physical Exam Blood pressure 133/87, height _0  (1.727 m), weight (!) 323 lb (146.5 kg), last menstrual period 04/29/2021, unknown if currently  breastfeeding.  General: NAD HEENT: normocephalic, anicteric Pulmonary: No increased work of breathing Abdomen: NABS, soft, non-tender, non-distended.  Umbilicus without lesions.  No hepatomegaly, splenomegaly or masses palpable. No evidence of hernia. Incision D/C/I Genitourinary:  External: Normal external female genitalia.  Normal urethral meatus, normal  Bartholin's and Skene's glands.    Vagina: Normal vaginal mucosa, no evidence of prolapse.    Cervix: Grossly normal in appearance, no bleeding  Uterus: Non-enlarged, mobile, normal contour.  No CMT  Adnexa: ovaries non-enlarged, no adnexal masses  Rectal: deferred Extremities: no edema, erythema, or tenderness Neurologic: Grossly intact Psychiatric: mood appropriate, affect full   Edinburgh Postnatal Depression Scale - 05/02/21 1047      Edinburgh Postnatal Depression Scale:  In the Past 7 Days   I have been able to laugh and see the funny side of things. 0    I have looked forward with enjoyment to things. 0    I have blamed myself unnecessarily when things went wrong. 1    I have been anxious or worried for no good reason. 0    I have felt scared or panicky for no good reason. 0    Things have been getting on top of me. 1    I have been so unhappy that I have had difficulty sleeping. 0    I have felt sad or miserable. 0    I have been so unhappy that I have been crying. 0    The thought of harming myself has occurred to me. 0    Edinburgh Postnatal Depression Scale Total 2           Assessment: 35 y.o. G1P1001 presenting for 6 week postpartum visit  Plan: Problem List Items Addressed This Visit   None   Visit Diagnoses    6 weeks postpartum follow-up    -  Primary   Screening for malignant neoplasm of cervix       Relevant Orders   Cytology - PAP   Oral contraception initial prescription          1) Contraception - Education given regarding options for contraception, as well as compatibility with breast  feeding if applicable.  Patient plans on OCP (estrogen/progesterone) for contraception.  2)  Pap - ASCCP guidelines and rational discussed.  ASCCP guidelines and rational discussed.  Patient opts for every 3 years screening interval  3) Patient underwent screening for postpartum depression with no signs of depression  4) No follow-ups on file.   Malachy Mood, MD, [facility], [facility] 05/02/2021, 10:58 AM

## 2021-05-07 LAB — CYTOLOGY - PAP
Adequacy: ABSENT
Comment: NEGATIVE
Diagnosis: NEGATIVE
High risk HPV: NEGATIVE

## 2021-06-10 ENCOUNTER — Ambulatory Visit (INDEPENDENT_AMBULATORY_CARE_PROVIDER_SITE_OTHER): Payer: 59 | Admitting: Internal Medicine

## 2021-06-10 ENCOUNTER — Other Ambulatory Visit: Payer: Self-pay

## 2021-06-10 ENCOUNTER — Encounter: Payer: Self-pay | Admitting: Internal Medicine

## 2021-06-10 VITALS — BP 112/74 | HR 76 | Ht 68.0 in | Wt 331.0 lb

## 2021-06-10 DIAGNOSIS — Z021 Encounter for pre-employment examination: Secondary | ICD-10-CM | POA: Diagnosis not present

## 2021-06-10 DIAGNOSIS — Z111 Encounter for screening for respiratory tuberculosis: Secondary | ICD-10-CM | POA: Diagnosis not present

## 2021-06-10 NOTE — Progress Notes (Signed)
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)  HPI  Lab Results  Component Value Date   CREATININE 0.8 09/21/2018   BUN 6 09/21/2018   NA 140 09/21/2018   K 3.9 09/21/2018   Lab Results  Component Value Date   HDL 47 09/21/2018   [place] 110 09/21/2018   TRIG 70 09/21/2018   Lab Results  Component Value Date   TSH 1.320 08/28/2020   Lab Results  Component Value Date   HGBA1C 5.9 (H) 08/28/2020   Lab Results  Component Value Date   WBC 17.1 (H) 03/20/2021   HGB 12.7 03/20/2021   HCT 37.0 03/20/2021   MCV 97.6 03/20/2021   PLT 250 03/20/2021   No results found for: ALT, AST, GGT, ALKPHOS, BILITOT   Review of Systems  Constitutional:  Negative for chills, fatigue and fever.  HENT:  Negative for trouble swallowing.   Respiratory:  Negative for cough, chest tightness and shortness of breath.   Cardiovascular:  Negative for chest pain, palpitations and leg swelling.  Gastrointestinal:  Negative for abdominal pain.  Neurological:  Negative for dizziness, light-headedness and headaches.  Psychiatric/Behavioral:  Negative for dysphoric mood and sleep disturbance. The patient is not nervous/anxious.    Patient Active Problem List   Diagnosis Date Noted   Cesarean delivery delivered 03/22/2021   BRCA gene mutation positive 09/25/2020   Irregular menses 03/17/2016   Environmental and seasonal allergies 03/17/2016   BMI 50.0-59.9, adult ([place]) 03/17/2016    No Known Allergies  Past Surgical History:  Procedure Laterality Date   CESAREAN SECTION  03/19/2021   Procedure: CESAREAN SECTION;  Surgeon: Malachy Mood, MD;  Location: [facility] ORS;  Service: Obstetrics;;   NO PAST SURGERIES      Social History   Tobacco Use   Smoking status: Never   Smokeless tobacco: Never  Vaping Use   Vaping Use: Never used  Substance Use Topics   Alcohol use: Not  Currently    Alcohol/week: 0.0 standard drinks    Comment: not during pregnancy   Drug use: No     Medication list has been reviewed and updated.  Current Meds  Medication Sig   ibuprofen (ADVIL) 800 MG tablet Take 1 tablet (800 mg total) by mouth every 8 (eight) hours.   norethindrone (MICRONOR) 0.35 MG tablet Take 1 tablet (0.35 mg total) by mouth daily.   Omega-3 Fatty Acids (OMEGA 3 500 PO) Take by mouth. Pt unsure of dose but takes 1 per day   Prenatal Vit-Fe Fumarate-FA (PRENATAL MULTIVITAMIN) TABS tablet Take 1 tablet by mouth daily at 12 noon.   vitamin k 100 MCG tablet Take 100 mcg by mouth in the morning and at bedtime.   [DISCONTINUED] acetaminophen (TYLENOL) 325 MG tablet Take 2 tablets (650 mg total) by mouth every 6 (six) hours as needed for fever or headache.   [DISCONTINUED] oxyCODONE (OXY IR/ROXICODONE) 5 MG immediate release tablet Take 1 tablet (5 mg total) by mouth every 6 (six) hours as needed for moderate pain or severe pain.    PHQ 2/9 Scores 06/10/2021 09/30/2019  PHQ - 2 Score 0 3  PHQ- 9 Score 1 12    GAD 7 : Generalized Anxiety Score 06/10/2021 09/30/2019  Nervous, Anxious, on Edge 0 3  Control/stop worrying 0 3  Worry too much - different things 0 3  Trouble relaxing 0 3  Restless 0 1  Easily annoyed or irritable 0 1  Afraid - awful might happen 0 2  Total GAD 7 Score 0 16  Anxiety Difficulty Not difficult at all Somewhat difficult    BP Readings from Last 3 Encounters:  06/10/21 112/74  05/02/21 133/87  04/04/21 135/81    Physical Exam Vitals and nursing note reviewed.  Constitutional:      General: She is not in acute distress.    Appearance: Normal appearance. She is well-developed.  HENT:     Head: Normocephalic and atraumatic.  Cardiovascular:     Rate and Rhythm: Normal rate and regular rhythm.     Pulses: Normal pulses.     Heart sounds: No murmur heard. Pulmonary:     Effort: Pulmonary effort is normal. No respiratory distress.      Breath sounds: No wheezing or rhonchi.  Musculoskeletal:     Cervical back: Normal range of motion.     Right lower leg: No edema.     Left lower leg: No edema.  Lymphadenopathy:     Cervical: No cervical adenopathy.  Skin:    General: Skin is warm and dry.     Capillary Refill: Capillary refill takes less than 2 seconds.     Findings: No rash.  Neurological:     General: No focal deficit present.     Mental Status: She is alert and oriented to person, place, and time.  Psychiatric:        Mood and Affect: Mood normal.        Behavior: Behavior normal.    Wt Readings from Last 3 Encounters:  06/10/21 (!) 331 lb (150.1 kg)  05/02/21 (!) 323 lb (146.5 kg)  04/04/21 (!) 321 lb (145.6 kg)    BP 112/74 (BP Location: Left Arm, Patient Position: Sitting, Cuff Size: Large)   Pulse 76   Ht 5' 8"  (1.727 m)   Wt (!) 331 lb (150.1 kg)   LMP 05/27/2021   SpO2 97%   Breastfeeding Yes   BMI 50.33 kg/m   Assessment and Plan: 1. Encounter for pre-employment examination Exam normal. No impediment to employment  2. Screening for tuberculosis - QuantiFERON-TB Gold Plus   Partially dictated using Editor, commissioning. Any errors are unintentional.  Halina Maidens, MD [facility]  06/10/2021

## 2021-06-13 LAB — QUANTIFERON-TB GOLD PLUS
QuantiFERON Mitogen Value: >10 IU/mL
QuantiFERON Nil Value: 0.02 IU/mL
QuantiFERON TB1 Ag Value: 0.01 IU/mL
QuantiFERON TB2 Ag Value: 0 IU/mL
QuantiFERON-TB Gold Plus: NEGATIVE

## 2021-06-24 ENCOUNTER — Other Ambulatory Visit: Payer: Self-pay | Admitting: Obstetrics and Gynecology

## 2021-06-24 MED ORDER — LO LOESTRIN FE 1 MG-10 MCG / 10 MCG PO TABS: 1.0000 | ORAL_TABLET | Freq: Every day | ORAL | 3 refills | Status: AC

## 2021-08-20 ENCOUNTER — Telehealth: Payer: Self-pay

## 2021-08-20 ENCOUNTER — Encounter: Payer: Self-pay | Admitting: Internal Medicine

## 2021-08-20 NOTE — Telephone Encounter (Signed)
Copied from CRM [id number]. Topic: General - Other >> Aug 20, 2021  9:33 AM Gaetana Michaelis A wrote: Reason for CRM: The patient shares that they've tested positive for COVID 19 via at home test on Monday 08/19/21  The patient is experiencing congestion as well as other flu like symptoms and would like to be prescribed something to help with discomfort   Please contact further when possible

## 2021-08-20 NOTE — Telephone Encounter (Signed)
Duplicate message  Pt sent message via mychart.  Responded to pt on mychart.  KP
